# Patient Record
Sex: Female | Born: 1985 | Race: White | Hispanic: No | Marital: Single | Smoking: Never smoker
Health system: Southern US, Community
[De-identification: ages and names within clinical notes are randomized; demographics above are authoritative.]

## PROBLEM LIST (undated history)

## (undated) DIAGNOSIS — N920 Excessive and frequent menstruation with regular cycle: Secondary | ICD-10-CM

## (undated) DIAGNOSIS — R011 Cardiac murmur, unspecified: Secondary | ICD-10-CM

## (undated) DIAGNOSIS — D649 Anemia, unspecified: Secondary | ICD-10-CM

## (undated) DIAGNOSIS — IMO0001 Reserved for inherently not codable concepts without codable children: Secondary | ICD-10-CM

## (undated) HISTORY — PX: TONSILLECTOMY: SUR1361

## (undated) HISTORY — PX: WISDOM TOOTH EXTRACTION: SHX21

---

## 2007-10-19 ENCOUNTER — Ambulatory Visit: Payer: Self-pay

## 2008-11-07 ENCOUNTER — Ambulatory Visit: Payer: Self-pay | Admitting: General Practice

## 2009-03-22 ENCOUNTER — Ambulatory Visit: Payer: Self-pay | Admitting: General Practice

## 2010-04-17 ENCOUNTER — Emergency Department: Payer: Self-pay | Admitting: Emergency Medicine

## 2010-08-28 ENCOUNTER — Ambulatory Visit: Payer: Self-pay

## 2011-09-15 ENCOUNTER — Emergency Department (HOSPITAL_COMMUNITY): Payer: No Typology Code available for payment source

## 2011-09-15 ENCOUNTER — Encounter (HOSPITAL_COMMUNITY): Payer: Self-pay | Admitting: *Deleted

## 2011-09-15 ENCOUNTER — Emergency Department (HOSPITAL_COMMUNITY)
Admission: EM | Admit: 2011-09-15 | Discharge: 2011-09-15 | Disposition: A | Discharge status: Home / Self Care | Payer: No Typology Code available for payment source | Attending: Emergency Medicine | Admitting: Emergency Medicine

## 2011-09-15 DIAGNOSIS — S8000XA Contusion of unspecified knee, initial encounter: Secondary | ICD-10-CM | POA: Insufficient documentation

## 2011-09-15 DIAGNOSIS — S8001XA Contusion of right knee, initial encounter: Secondary | ICD-10-CM

## 2011-09-15 DIAGNOSIS — T148XXA Other injury of unspecified body region, initial encounter: Secondary | ICD-10-CM

## 2011-09-15 DIAGNOSIS — Y92410 Unspecified street and highway as the place of occurrence of the external cause: Secondary | ICD-10-CM | POA: Insufficient documentation

## 2011-09-15 MED ORDER — OXYCODONE-ACETAMINOPHEN 5-325 MG PO TABS
1.0000 | ORAL_TABLET | Freq: Once | ORAL | Status: AC
  Administered 2011-09-15: 1 via ORAL
  Filled 2011-09-15: qty 1

## 2011-09-15 MED ORDER — HYDROCODONE-ACETAMINOPHEN 5-325 MG PO TABS
1.0000 | ORAL_TABLET | Freq: Once | ORAL | Status: AC
  Administered 2011-09-15: 1 via ORAL
  Filled 2011-09-15: qty 1

## 2011-09-15 MED ORDER — IBUPROFEN 800 MG PO TABS: 800.0000 mg | ORAL_TABLET | Freq: Three times a day (TID) | ORAL | Status: AC
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
## 2011-09-15 NOTE — ED Provider Notes (Signed)
Medical screening examination/treatment/procedure(s) were performed by non-physician practitioner and as supervising physician I was immediately available for consultation/collaboration.   Nuh Lipton A Siennah Barrasso, MD 09/15/11 0642 

## 2011-10-14 ENCOUNTER — Ambulatory Visit: Payer: Self-pay | Admitting: General Practice

## 2011-10-16 ENCOUNTER — Encounter: Payer: Self-pay | Admitting: Physician Assistant

## 2011-11-01 ENCOUNTER — Encounter: Payer: Self-pay | Admitting: Physician Assistant

## 2012-06-12 ENCOUNTER — Emergency Department: Payer: Self-pay | Admitting: Emergency Medicine

## 2012-07-17 ENCOUNTER — Emergency Department: Payer: Self-pay | Admitting: Emergency Medicine

## 2013-07-18 ENCOUNTER — Ambulatory Visit: Payer: Self-pay | Admitting: Family Medicine

## 2013-07-18 DIAGNOSIS — Z0289 Encounter for other administrative examinations: Secondary | ICD-10-CM

## 2014-06-23 ENCOUNTER — Ambulatory Visit: Payer: Self-pay | Admitting: Family Medicine
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
## 2015-06-05 ENCOUNTER — Ambulatory Visit: Payer: Self-pay | Admitting: Physician Assistant

## 2015-06-05 ENCOUNTER — Encounter: Payer: Self-pay | Admitting: Physician Assistant

## 2015-06-05 VITALS — BP 120/80 | HR 72 | Temp 98.5°F

## 2015-06-05 DIAGNOSIS — J069 Acute upper respiratory infection, unspecified: Secondary | ICD-10-CM

## 2015-06-05 MED ORDER — HYDROCOD POLST-CPM POLST ER 10-8 MG/5ML PO SUER: 5.0000 mL | Freq: Two times a day (BID) | ORAL | Status: DC | PRN

## 2015-06-05 MED ORDER — METHYLPREDNISOLONE 4 MG PO TBPK: ORAL_TABLET | ORAL | Status: AC

## 2015-06-05 MED ORDER — AZITHROMYCIN 250 MG PO TABS: ORAL_TABLET | ORAL | Status: DC

## 2015-06-05 NOTE — Progress Notes (Signed)
S: C/o runny nose and congestion for 7 days, cough, sore throat, hoarse voice, no fever, chills, cp/sob, v/d; mucus is green and thick, cough is sporadic, c/o of facial and dental pain.   Using otc meds:   O: PE: vitals wnl, nad, perrl eomi, normocephalic, tms dull, nasal mucosa red and swollen, throat injected, neck supple no lymph, lungs c t a, cv rrr, neuro intact, cough is dry and hacking  A:  Acute sinusitis, uri   P: zpack, medrol dose pack, tussionex, drink fluids, continue regular meds , use otc meds of choice, return if not improving in 5 days, return earlier if worsening

## 2015-07-09 ENCOUNTER — Encounter: Payer: Self-pay | Admitting: Physician Assistant

## 2015-07-09 ENCOUNTER — Ambulatory Visit: Payer: Self-pay | Admitting: Physician Assistant

## 2015-07-09 VITALS — BP 119/79 | HR 79 | Temp 97.8°F

## 2015-07-09 DIAGNOSIS — J018 Other acute sinusitis: Secondary | ICD-10-CM

## 2015-07-09 MED ORDER — FLUCONAZOLE 150 MG PO TABS
ORAL_TABLET | ORAL | Status: DC
Start: 2015-07-09 — End: 2015-08-22

## 2015-07-09 MED ORDER — AMOXICILLIN 875 MG PO TABS: 875.0000 mg | ORAL_TABLET | Freq: Two times a day (BID) | ORAL | Status: DC

## 2015-07-09 NOTE — Progress Notes (Signed)
S: C/o runny nose and congestion for 5 days, no fever, chills, cp/sob, v/d; mucus is green and thick, cough is sporadic, c/o of facial and dental pain. Some sneezing and eyes watering, several coworkers have the same  Using otc meds:   O: PE: vitals nwl, nad,  perrl eomi, normocephalic, tms dull, nasal mucosa red and swollen, throat injected, neck supple no lymph, lungs c t a, cv rrr, neuro intact  A:  Acute sinusitis   P: amoxil 875 bid x 10d, diflucan; drink fluids, continue regular meds , use otc meds of choice, return if not improving in 5 days, return earlier if worsening

## 2015-08-22 ENCOUNTER — Ambulatory Visit: Payer: Self-pay | Admitting: Physician Assistant

## 2015-08-22 ENCOUNTER — Encounter: Payer: Self-pay | Admitting: Physician Assistant

## 2015-08-22 VITALS — BP 125/65 | HR 115 | Temp 98.4°F

## 2015-08-22 DIAGNOSIS — R509 Fever, unspecified: Secondary | ICD-10-CM

## 2015-08-22 LAB — POCT INFLUENZA A/B
Influenza A, POC: NEGATIVE
Influenza B, POC: NEGATIVE

## 2015-08-22 MED ORDER — METHYLPREDNISOLONE 4 MG PO TBPK: ORAL_TABLET | ORAL | Status: DC

## 2015-08-22 MED ORDER — HYDROCOD POLST-CPM POLST ER 10-8 MG/5ML PO SUER: 5.0000 mL | Freq: Two times a day (BID) | ORAL | Status: DC | PRN

## 2015-08-22 NOTE — Progress Notes (Signed)
S: C/o runny nose and congestion with dry cough for 3 days, + fever, chills, denies cp/sob, v/d;  Chest does feel a little tight, just weak and tired  Using otc meds: robitussin  O: PE: vitals wnl, nad, pt appears to not feel well;  perrl eomi, normocephalic, tms dull, nasal mucosa red and swollen, throat injected, neck supple no lymph, lungs c t a, cv rrr, neuro intact, flu swab neg  A:  Acute flu like illness   P: medrol dose pack for wheezing, tussionex 174ml nr for cough; drink fluids, continue regular meds , use otc meds of choice, return if not improving in 5 days, return earlier if worsening , if getting worse will call in omnicef on FRiday prior to the weekend

## 2015-08-24 ENCOUNTER — Ambulatory Visit: Payer: Self-pay | Admitting: Physician Assistant

## 2015-09-18 ENCOUNTER — Encounter: Payer: Self-pay | Admitting: Physician Assistant

## 2015-09-18 ENCOUNTER — Ambulatory Visit: Payer: Self-pay | Admitting: Physician Assistant

## 2015-09-18 VITALS — BP 110/60 | HR 85 | Temp 98.0°F

## 2015-09-18 DIAGNOSIS — N921 Excessive and frequent menstruation with irregular cycle: Secondary | ICD-10-CM

## 2015-09-18 DIAGNOSIS — R0602 Shortness of breath: Secondary | ICD-10-CM

## 2015-09-18 NOTE — Progress Notes (Signed)
S: c/o being a sob when running or going up stairs, heavy menstrual bleeding, has uterine fibroid, was seen by A. Copeland and given depo shot to reduce bleeding, has been having her period since Feb, recently started taking otc iron, has hx of heart murmur, hasn't been checked in several years  O: vitals wnl, nad, lungs c t a, cv rrr, murmur noted at aortic valve, ekg nsr  A: fatigue, sob, heart murmur, heavy vaginal bleeding  P: f/u with Dr Kenton Kingfisher for already scheduled appointment, cbc tsh drawn today, will refer to cardiology to assess heart murmur

## 2015-09-19 DIAGNOSIS — R011 Cardiac murmur, unspecified: Secondary | ICD-10-CM | POA: Insufficient documentation

## 2015-09-19 LAB — CBC WITH DIFFERENTIAL/PLATELET
BASOS: 0 %
Basophils Absolute: 0 10*3/uL (ref 0.0–0.2)
EOS (ABSOLUTE): 0 10*3/uL (ref 0.0–0.4)
Eos: 0 %
Hematocrit: 28.6 % — ABNORMAL LOW (ref 34.0–46.6)
Hemoglobin: 9.1 g/dL — ABNORMAL LOW (ref 11.1–15.9)
IMMATURE GRANS (ABS): 0 10*3/uL (ref 0.0–0.1)
Immature Granulocytes: 0 %
LYMPHS ABS: 1.4 10*3/uL (ref 0.7–3.1)
Lymphs: 27 %
MCH: 27.2 pg (ref 26.6–33.0)
MCHC: 31.8 g/dL (ref 31.5–35.7)
MCV: 86 fL (ref 79–97)
MONOS ABS: 0.6 10*3/uL (ref 0.1–0.9)
Monocytes: 13 %
NEUTROS ABS: 2.9 10*3/uL (ref 1.4–7.0)
Neutrophils: 60 %
PLATELETS: 410 10*3/uL — AB (ref 150–379)
RBC: 3.34 x10E6/uL — ABNORMAL LOW (ref 3.77–5.28)
RDW: 15.1 % (ref 12.3–15.4)
WBC: 5 10*3/uL (ref 3.4–10.8)

## 2015-09-19 LAB — THYROID PANEL WITH TSH
Free Thyroxine Index: 1.9 (ref 1.2–4.9)
T3 UPTAKE RATIO: 27 % (ref 24–39)
T4 TOTAL: 6.9 ug/dL (ref 4.5–12.0)
TSH: 0.639 u[IU]/mL (ref 0.450–4.500)

## 2015-10-31 ENCOUNTER — Encounter
Admission: RE | Admit: 2015-10-31 | Discharge: 2015-10-31 | Disposition: A | Discharge status: Home / Self Care | Payer: Managed Care, Other (non HMO) | Source: Ambulatory Visit | Attending: Obstetrics & Gynecology | Admitting: Obstetrics & Gynecology

## 2015-10-31 DIAGNOSIS — Z01812 Encounter for preprocedural laboratory examination: Secondary | ICD-10-CM | POA: Insufficient documentation

## 2015-10-31 HISTORY — DX: Reserved for inherently not codable concepts without codable children: IMO0001

## 2015-10-31 HISTORY — DX: Cardiac murmur, unspecified: R01.1

## 2015-10-31 HISTORY — DX: Excessive and frequent menstruation with regular cycle: N92.0

## 2015-10-31 LAB — CBC
HEMATOCRIT: 32.9 % — AB (ref 35.0–47.0)
Hemoglobin: 10.6 g/dL — ABNORMAL LOW (ref 12.0–16.0)
MCH: 25.4 pg — ABNORMAL LOW (ref 26.0–34.0)
MCHC: 32.3 g/dL (ref 32.0–36.0)
MCV: 78.5 fL — ABNORMAL LOW (ref 80.0–100.0)
PLATELETS: 294 10*3/uL (ref 150–440)
RBC: 4.19 MIL/uL (ref 3.80–5.20)
RDW: 17.2 % — AB (ref 11.5–14.5)
WBC: 3.4 10*3/uL — AB (ref 3.6–11.0)

## 2015-10-31 LAB — TYPE AND SCREEN
ABO/RH(D): O NEG
ANTIBODY SCREEN: NEGATIVE

## 2015-10-31 LAB — ABO/RH: ABO/RH(D): O NEG

## 2015-10-31 NOTE — Patient Instructions (Signed)
  Your procedure is scheduled on:11/08/15 Thurs Report to Same Day Surgery 2nd floor medical mall To find out your arrival time please call [phone] between [place] on Wed 11/07/15  Remember: Instructions that are not followed completely may result in serious medical risk, up to and including death, or upon the discretion of your surgeon and anesthesiologist your surgery may need to be rescheduled.    _x___ 1. Do not eat food or drink liquids after midnight. No gum chewing or hard candies.     __x__ 2. No Alcohol for 24 hours before or after surgery.   ____ 3. Bring all medications with you on the day of surgery if instructed.    __x__ 4. Notify your doctor if there is any change in your medical condition     (cold, fever, infections).     Do not wear jewelry, make-up, hairpins, clips or nail polish.  Do not wear lotions, powders, or perfumes. You may wear deodorant.  Do not shave 48 hours prior to surgery. Men may shave face and neck.  Do not bring valuables to the hospital.    [facility] is not responsible for any belongings or valuables.               Contacts, dentures or bridgework may not be worn into surgery.  Leave your suitcase in the car. After surgery it may be brought to your room.  For patients admitted to the hospital, discharge time is determined by your treatment team.   Patients discharged the day of surgery will not be allowed to drive home.    Please read over the following fact sheets that you were given:   [facility] Preparing for Surgery and or MRSA Information   _x___ Take these medicines the morning of surgery with A SIP OF WATER:    1. None  2.  3.  4.  5.  6.  ____ Fleet Enema (as directed)   _x___ Use CHG Soap or sage wipes as directed on instruction sheet   ____ Use inhalers on the day of surgery and bring to hospital day of surgery  ____ Stop metformin 2 days prior to surgery    ____ Take 1/2 of usual insulin dose the night before  surgery and none on the morning of           surgery.   ____ Stop aspirin or coumadin, or plavix  _x__ Stop Anti-inflammatories such as Advil, Aleve, Ibuprofen, Motrin, Naproxen,          Naprosyn, Goodies powders or aspirin products. Ok to take Tylenol.   ____ Stop supplements until after surgery.    ____ Bring C-Pap to the hospital.

## 2015-10-31 NOTE — Pre-Procedure Instructions (Signed)
[name], MD - 10/03/2015 9:30 AM EDT Formatting of this note may be different from the original. Established Patient Visit   Chief Complaint: Chief Complaint  Patient presents with  . Follow-up  ETT  Date of Service: 10/03/2015 Date of Birth: [birth year] PCP: Leona Carry TATE, MD  History of Present Illness: [name] is a 30 y.o.female patient who returns for evaluation of cardiac murmur and exertional dyspnea. The patient has had a history of a murmur since a young child out history of rheumatic fever per had a recent history of exertional dyspnea which is been attributed to the menstrual bleeding anemia. The patient reports that she has been doing much better, with resolution of menstrual bleeding. Underwent ETT on 09/27/2015, exercise 12 minutes on a Bruce protocol, without chest pain, shortness of breath or ECG changes. The patient has resumed her usual activity, and is exercising regularly, doing cardio on the elliptical for 2 miles without difficulty.  Past Medical and Surgical History  Past Medical History History reviewed. No pertinent past medical history.  Past Surgical History She has a past surgical history that includes tonsilectomy.   Medications and Allergies  Current Medications  Current Outpatient Prescriptions  Medication Sig Dispense Refill  . ferrous sulfate 325 (65 FE) MG tablet Take 325 mg by mouth daily with breakfast.  . medroxyPROGESTERone (DEPO-PROVERA) 150 mg/mL IM syringe INJECT 1 MILLILITER (150 MG) BY INTRAMUSCULAR ROUTE EVERY 3 MONTHS 0   No current facility-administered medications for this visit.   Allergies: Review of patient's allergies indicates no known allergies.  Social and Family History  Social History reports that she has never smoked. She does not have any smokeless tobacco history on file. She reports that she drinks alcohol. She reports that she does not use illicit drugs.  Family History Family History  Problem Relation Age of  Onset  . Coronary artery disease Father   Review of Systems   Review of Systems: The patient denies chest pain, shortness of breath, orthopnea, paroxysmal nocturnal dyspnea, pedal edema, palpitations, heart racing, presyncope, syncope. Review of 12 Systems is negative except as described above.  Physical Examination   Vitals: Visit Vitals  . BP (P) 102/60  . Pulse (P) 64  . Ht (P) 170.2 cm (5\' 7" )  . Wt (P) 69.6 kg (153 lb 6.4 oz)  . BMI (P) 24.03 kg/m2   Ht:(P) 170.2 cm (5\' 7" ) Wt:(P) 69.6 kg (153 lb 6.4 oz) [id number] surface area is 1.81 meters squared (pended). Body mass index is 24.03 kg/(m^2) (pended).  HEENT: Pupils equally reactive to light and accomodation  Neck: Supple without thyromegaly, carotid pulses 2+ Lungs: clear to auscultation bilaterally; no wheezes, rales, rhonchi Heart: Regular rate and rhythm. Grade 1/6 systolic murmur Abdomen: soft nontender, nondistended, with normal bowel sounds Extremities: no cyanosis, clubbing, or edema Peripheral Pulses: 2+ in all extremities, 2+ femoral pulses bilaterally  Assessment   30 y.o. female with  1. Cardiac murmur, unspecified  2. SOB (shortness of breath) on exertion   30 year old female with exertional dyspnea, likely due to anemia secondary to heavy menstrual bleeding which is resolved. Patient underwent ETT without chest pain, shortness of breath, or ECG changes. Mild systolic murmur likely not of clinical significance.  Plan   1. Continue current medications 2. Defer further cardiac diagnostics at this time 3. Return to clinic for follow-up as needed  No orders of the defined types were placed in this encounter.  Return if symptoms worsen or fail to improve.  ALEXANDER  PARASCHOS, MD

## 2015-11-08 ENCOUNTER — Ambulatory Visit: Payer: Managed Care, Other (non HMO) | Admitting: Anesthesiology

## 2015-11-08 ENCOUNTER — Ambulatory Visit
Admission: RE | Admit: 2015-11-08 | Discharge: 2015-11-08 | Disposition: A | Discharge status: Home / Self Care | Payer: Managed Care, Other (non HMO) | Source: Ambulatory Visit | Attending: Obstetrics & Gynecology | Admitting: Obstetrics & Gynecology

## 2015-11-08 ENCOUNTER — Encounter
Admission: RE | Disposition: A | Discharge status: Home / Self Care | Payer: Self-pay | Source: Ambulatory Visit | Attending: Obstetrics & Gynecology

## 2015-11-08 ENCOUNTER — Encounter: Payer: Self-pay | Admitting: Anesthesiology

## 2015-11-08 DIAGNOSIS — Z9889 Other specified postprocedural states: Secondary | ICD-10-CM | POA: Insufficient documentation

## 2015-11-08 DIAGNOSIS — N921 Excessive and frequent menstruation with irregular cycle: Secondary | ICD-10-CM | POA: Diagnosis not present

## 2015-11-08 DIAGNOSIS — Z8249 Family history of ischemic heart disease and other diseases of the circulatory system: Secondary | ICD-10-CM | POA: Insufficient documentation

## 2015-11-08 DIAGNOSIS — Z842 Family history of other diseases of the genitourinary system: Secondary | ICD-10-CM | POA: Insufficient documentation

## 2015-11-08 DIAGNOSIS — N946 Dysmenorrhea, unspecified: Secondary | ICD-10-CM | POA: Insufficient documentation

## 2015-11-08 DIAGNOSIS — N920 Excessive and frequent menstruation with regular cycle: Secondary | ICD-10-CM | POA: Diagnosis present

## 2015-11-08 DIAGNOSIS — Z793 Long term (current) use of hormonal contraceptives: Secondary | ICD-10-CM | POA: Diagnosis not present

## 2015-11-08 DIAGNOSIS — Z79899 Other long term (current) drug therapy: Secondary | ICD-10-CM | POA: Diagnosis not present

## 2015-11-08 DIAGNOSIS — Z8041 Family history of malignant neoplasm of ovary: Secondary | ICD-10-CM | POA: Insufficient documentation

## 2015-11-08 DIAGNOSIS — R5383 Other fatigue: Secondary | ICD-10-CM | POA: Diagnosis present

## 2015-11-08 DIAGNOSIS — F419 Anxiety disorder, unspecified: Secondary | ICD-10-CM | POA: Diagnosis not present

## 2015-11-08 DIAGNOSIS — D219 Benign neoplasm of connective and other soft tissue, unspecified: Secondary | ICD-10-CM | POA: Diagnosis present

## 2015-11-08 HISTORY — PX: LAPAROSCOPIC HYSTERECTOMY: SHX1926

## 2015-11-08 HISTORY — PX: CYSTOSCOPY: SHX5120

## 2015-11-08 LAB — POCT PREGNANCY, URINE: PREG TEST UR: NEGATIVE

## 2015-11-08 SURGERY — HYSTERECTOMY, TOTAL, LAPAROSCOPIC
Anesthesia: General

## 2015-11-08 MED ORDER — NEOSTIGMINE METHYLSULFATE 10 MG/10ML IV SOLN
INTRAVENOUS | Status: DC | PRN
  Administered 2015-11-08: 3 mg via INTRAVENOUS

## 2015-11-08 MED ORDER — OXYCODONE-ACETAMINOPHEN 5-325 MG PO TABS
ORAL_TABLET | ORAL | Status: AC
  Administered 2015-11-08: 1 via ORAL
  Filled 2015-11-08: qty 1

## 2015-11-08 MED ORDER — LACTATED RINGERS IV SOLN
INTRAVENOUS | Status: DC
  Administered 2015-11-08: 10:00:00 via INTRAVENOUS

## 2015-11-08 MED ORDER — ONDANSETRON HCL 4 MG/2ML IJ SOLN: 4.0000 mg | Freq: Once | INTRAMUSCULAR | Status: DC | PRN

## 2015-11-08 MED ORDER — OXYCODONE-ACETAMINOPHEN 5-325 MG PO TABS: 1.0000 | ORAL_TABLET | ORAL | Status: DC | PRN

## 2015-11-08 MED ORDER — BUPIVACAINE HCL (PF) 0.5 % IJ SOLN
INTRAMUSCULAR | Status: DC | PRN
  Administered 2015-11-08: 7 mL

## 2015-11-08 MED ORDER — LIDOCAINE HCL (CARDIAC) 20 MG/ML IV SOLN
INTRAVENOUS | Status: DC | PRN
  Administered 2015-11-08: 80 mg via INTRAVENOUS

## 2015-11-08 MED ORDER — FAMOTIDINE 20 MG PO TABS
ORAL_TABLET | ORAL | Status: AC
  Administered 2015-11-08: 20 mg via ORAL
  Filled 2015-11-08: qty 1

## 2015-11-08 MED ORDER — FENTANYL CITRATE (PF) 100 MCG/2ML IJ SOLN
INTRAMUSCULAR | Status: AC
  Filled 2015-11-08: qty 2

## 2015-11-08 MED ORDER — FENTANYL CITRATE (PF) 100 MCG/2ML IJ SOLN
25.0000 ug | INTRAMUSCULAR | Status: AC | PRN
  Administered 2015-11-08 (×6): 25 ug via INTRAVENOUS

## 2015-11-08 MED ORDER — FAMOTIDINE 20 MG PO TABS
20.0000 mg | ORAL_TABLET | Freq: Once | ORAL | Status: AC
  Administered 2015-11-08: 20 mg via ORAL

## 2015-11-08 MED ORDER — FENTANYL CITRATE (PF) 100 MCG/2ML IJ SOLN
INTRAMUSCULAR | Status: DC | PRN
  Administered 2015-11-08 (×4): 50 ug via INTRAVENOUS

## 2015-11-08 MED ORDER — DEXAMETHASONE SODIUM PHOSPHATE 10 MG/ML IJ SOLN
INTRAMUSCULAR | Status: DC | PRN
  Administered 2015-11-08: 4 mg via INTRAVENOUS

## 2015-11-08 MED ORDER — CEFOXITIN SODIUM-DEXTROSE 2-2.2 GM-% IV SOLR (PREMIX)
2.0000 g | Freq: Once | INTRAVENOUS | Status: AC
  Administered 2015-11-08: 2000 mg via INTRAVENOUS

## 2015-11-08 MED ORDER — PROPOFOL 10 MG/ML IV BOLUS
INTRAVENOUS | Status: DC | PRN
  Administered 2015-11-08: 50 mg via INTRAVENOUS
  Administered 2015-11-08: 150 mg via INTRAVENOUS

## 2015-11-08 MED ORDER — ONDANSETRON HCL 4 MG/2ML IJ SOLN
INTRAMUSCULAR | Status: DC | PRN
  Administered 2015-11-08: 4 mg via INTRAVENOUS

## 2015-11-08 MED ORDER — BUPIVACAINE HCL (PF) 0.5 % IJ SOLN
INTRAMUSCULAR | Status: AC
  Filled 2015-11-08: qty 30

## 2015-11-08 MED ORDER — OXYCODONE-ACETAMINOPHEN 5-325 MG PO TABS
1.0000 | ORAL_TABLET | Freq: Once | ORAL | Status: AC
  Administered 2015-11-08: 1 via ORAL

## 2015-11-08 MED ORDER — GLYCOPYRROLATE 0.2 MG/ML IJ SOLN
INTRAMUSCULAR | Status: DC | PRN
  Administered 2015-11-08: 0.4 mg via INTRAVENOUS

## 2015-11-08 MED ORDER — ROCURONIUM BROMIDE 100 MG/10ML IV SOLN
INTRAVENOUS | Status: DC | PRN
  Administered 2015-11-08: 30 mg via INTRAVENOUS
  Administered 2015-11-08: 5 mg via INTRAVENOUS
  Administered 2015-11-08: 10 mg via INTRAVENOUS
  Administered 2015-11-08: 5 mg via INTRAVENOUS
  Administered 2015-11-08: 10 mg via INTRAVENOUS

## 2015-11-08 MED ORDER — CEFOXITIN SODIUM-DEXTROSE 2-2.2 GM-% IV SOLR (PREMIX)
INTRAVENOUS | Status: AC
  Administered 2015-11-08: 2000 mg via INTRAVENOUS
  Filled 2015-11-08: qty 50

## 2015-11-08 MED ORDER — MIDAZOLAM HCL 2 MG/2ML IJ SOLN
INTRAMUSCULAR | Status: DC | PRN
  Administered 2015-11-08: 2 mg via INTRAVENOUS

## 2015-11-08 MED ORDER — KETOROLAC TROMETHAMINE 30 MG/ML IJ SOLN
INTRAMUSCULAR | Status: DC | PRN
  Administered 2015-11-08: 30 mg via INTRAVENOUS

## 2015-11-08 SURGICAL SUPPLY — 51 items
BAG URO DRAIN 2000ML W/SPOUT (MISCELLANEOUS) ×4 IMPLANT
BLADE SURG SZ11 CARB STEEL (BLADE) ×4 IMPLANT
CANISTER SUCT 1200ML W/VALVE (MISCELLANEOUS) ×4 IMPLANT
CATH FOLEY 2WAY  5CC 16FR (CATHETERS) ×2
CATH ROBINSON RED A/P 16FR (CATHETERS) ×4 IMPLANT
CATH URTH 16FR FL 2W BLN LF (CATHETERS) ×2 IMPLANT
CHLORAPREP W/TINT 26ML (MISCELLANEOUS) ×4 IMPLANT
DEFOGGER SCOPE WARMER CLEARIFY (MISCELLANEOUS) ×4 IMPLANT
DEVICE SUTURE ENDOST 10MM (ENDOMECHANICALS) ×4 IMPLANT
DRAPE CAMERA CLOSED 9X96 (DRAPES) ×4 IMPLANT
DRAPE UNDER BUTTOCK W/FLU (DRAPES) ×4 IMPLANT
DRSG TEGADERM 2-3/8X2-3/4 SM (GAUZE/BANDAGES/DRESSINGS) IMPLANT
ENDOSTITCH 0 SINGLE 48 (SUTURE) ×20 IMPLANT
GAUZE SPONGE NON-WVN 2X2 STRL (MISCELLANEOUS) IMPLANT
GLOVE BIO SURGEON STRL SZ8 (GLOVE) ×20 IMPLANT
GLOVE INDICATOR 8.0 STRL GRN (GLOVE) ×4 IMPLANT
GOWN STRL REUS W/ TWL LRG LVL3 (GOWN DISPOSABLE) ×2 IMPLANT
GOWN STRL REUS W/ TWL XL LVL3 (GOWN DISPOSABLE) ×4 IMPLANT
GOWN STRL REUS W/TWL LRG LVL3 (GOWN DISPOSABLE) ×2
GOWN STRL REUS W/TWL XL LVL3 (GOWN DISPOSABLE) ×4
IRRIGATION STRYKERFLOW (MISCELLANEOUS) ×2 IMPLANT
IRRIGATOR STRYKERFLOW (MISCELLANEOUS) ×4
IV LACTATED RINGERS 1000ML (IV SOLUTION) ×4 IMPLANT
KIT PINK PAD W/HEAD ARE REST (MISCELLANEOUS) ×4
KIT PINK PAD W/HEAD ARM REST (MISCELLANEOUS) ×2 IMPLANT
KIT RM TURNOVER CYSTO AR (KITS) ×4 IMPLANT
LABEL OR SOLS (LABEL) ×4 IMPLANT
LIQUID BAND (GAUZE/BANDAGES/DRESSINGS) ×4 IMPLANT
MANIPULATOR VCARE LG CRV RETR (MISCELLANEOUS) ×4 IMPLANT
MANIPULATOR VCARE SML CRV RETR (MISCELLANEOUS) IMPLANT
MANIPULATOR VCARE STD CRV RETR (MISCELLANEOUS) IMPLANT
NEEDLE VERESS 14GA 120MM (NEEDLE) ×4 IMPLANT
NS IRRIG 500ML POUR BTL (IV SOLUTION) ×4 IMPLANT
OCCLUDER COLPOPNEUMO (BALLOONS) ×4 IMPLANT
PACK GYN LAPAROSCOPIC (MISCELLANEOUS) ×4 IMPLANT
PAD OB MATERNITY 4.3X12.25 (PERSONAL CARE ITEMS) ×4 IMPLANT
PAD PREP 24X41 OB/GYN DISP (PERSONAL CARE ITEMS) ×4 IMPLANT
SCISSORS METZENBAUM CVD 33 (INSTRUMENTS) ×4 IMPLANT
SET CYSTO W/LG BORE CLAMP LF (SET/KITS/TRAYS/PACK) ×4 IMPLANT
SHEARS HARMONIC ACE PLUS 36CM (ENDOMECHANICALS) ×4 IMPLANT
SLEEVE ENDOPATH XCEL 5M (ENDOMECHANICALS) ×4 IMPLANT
SPONGE LAP 18X18 5 PK (GAUZE/BANDAGES/DRESSINGS) ×8 IMPLANT
SPONGE VERSALON 2X2 STRL (MISCELLANEOUS)
SPONGE XRAY 4X4 16PLY STRL (MISCELLANEOUS) ×4 IMPLANT
SURGILUBE 2OZ TUBE FLIPTOP (MISCELLANEOUS) ×4 IMPLANT
SUT VIC AB 2-0 UR6 27 (SUTURE) IMPLANT
SYR 50ML LL SCALE MARK (SYRINGE) ×4 IMPLANT
SYRINGE 10CC LL (SYRINGE) ×4 IMPLANT
TROCAR ENDO BLADELESS 11MM (ENDOMECHANICALS) ×4 IMPLANT
TROCAR XCEL NON-BLD 5MMX100MML (ENDOMECHANICALS) ×4 IMPLANT
TUBING INSUFFLATOR HEATED (MISCELLANEOUS) ×4 IMPLANT

## 2015-11-08 NOTE — Op Note (Signed)
Operative Note:  PRE-OP DIAGNOSIS: MENOMETRORRHAGIA,DYSMENORRHEA, FATIGUE   POST-OP DIAGNOSIS: MENOMETRORRHAGIA,DYSMENORRHEA, FATIGUE   PROCEDURE: Procedure(s): HYSTERECTOMY TOTAL LAPAROSCOPIC / BILATERAL SALPINGECTOMY CYSTOSCOPY  SURGEON: Barnett Applebaum, MD, FACOG  ASSISTANT: Dr Leonides Schanz   ANESTHESIA: General endotracheal anesthesia  ESTIMATED BLOOD LOSS: < 20 mL  SPECIMENS: Uterus, Tubes.  COMPLICATIONS: None  DISPOSITION: stable to PACU  FINDINGS: Intraabdominal adhesions were not noted. Normal ovaries, appendix, liver, GB.  PROCEDURE:  The patient was taken to the OR where anesthesia was administed. She was prepped and draped in the normal sterile fashion in the dorsal lithotomy position in the [place] stirrups. A time out was performed. A Graves speculum was inserted, the cervix was grasped with a single tooth tenaculum and the endometrial cavity was sounded. The cervix was progressively dilated to a size 18 [place] with [employer] dilators. A V-Care uterine manipulator was inserted in the usual fashion without incident. Gloves were changed and attention was turned to the abdomen.   An infraumbilical transverse 72mm skin incision was made with the scalpel after local anesthesia applied to the skin. A Veress-step needle was inserted in the usual fashion and confirmed using the hanging drop technique. A pneumoperitoneum was obtained by insufflation of CO2 (opening pressure of 13mmHg) to 13mmHg. A diagnostic laparoscopy was performed yielding the previously described findings. Attention was turned to the left lower quadrant where after visualization of the inferior epigastric vessels a 2mm skin incision was made with the scalpel. A 5 mm laparoscopic port was inserted. The same procedure was repeated in the right lower quadrant with a 8mm trocar. Attention was turned to the left aspect of the uterus, where after visualization of the ureter, the round ligament was coagulated and transected using the  12mm Harmonic Scapel. The anterior and posterior leafs of the broad ligament were dissected off as the anterior one was coagulated and transected in a caudal direction towards the cuff of the uterine manipulator. The vesicouterine reflection of the peritoneum was dissected with the harmonic scapel and the bladder flap was created bluntly. Attention was then turned to the left fallopian tube which was recognized by visualization of the fimbria. First the mesosalpinx and then the utero-ovarian ligament were coagulated and transected using the Harmonic Scapel. Attention was turned to the right aspect of the uterus where the same procedure was performed. The uterine vessels were sealed and transected bilaterally using first bipolar cautery and then the harmonic scapel. A 360 degree, circumferential colpotomy was done to completely amputate the uterus with cervix and tubes. Once the specimen was amputated it was delivered through the vagina.   The colpotomy was repaired in a simple interrupted ashion using a 0-Polysorb suture with an endo-stitch device.  Vaginal exam confirms complete closure.  The cavity was copiously irrigated. A survey of the pelvic cavity revealed adequate hemostasis and no injury to bowel, bladder, or ureter.  A diagnostic cystoscopy was performed using saline distension of bladder, with bilateral urine flow from each ureteral orifice.  No bladder lesions or injuries seen.   At this point the procedure was finalized. All the instruments were removed from the patient's body. Gas was expelled and patient is leveled.  Incisions are closed with skin adhesive.    Patient goes to recovery room in stable condition.  All sponge, instrument, and needle counts are correct x2.

## 2015-11-08 NOTE — Anesthesia Postprocedure Evaluation (Signed)
Anesthesia Post Note  Patient: Ann Vega  Procedure(s) Performed: Procedure(s) (LRB): HYSTERECTOMY TOTAL LAPAROSCOPIC / BILATERAL SALPINGECTOMY (Bilateral) CYSTOSCOPY (N/A)  Patient location during evaluation: PACU Anesthesia Type: General Level of consciousness: awake and alert Pain management: pain level controlled Vital Signs Assessment: post-procedure vital signs reviewed and stable Respiratory status: spontaneous breathing, nonlabored ventilation, respiratory function stable and patient connected to nasal cannula oxygen Cardiovascular status: blood pressure returned to baseline and stable Postop Assessment: no signs of nausea or vomiting Anesthetic complications: no    Last Vitals:  Filed Vitals:   11/08/15 1255 11/08/15 1306  BP:  118/58  Pulse: 71 60  Temp:  35.9 C  Resp: 14 16    Last Pain:  Filed Vitals:   11/08/15 1309  PainSc: [place]

## 2015-11-08 NOTE — Transfer of Care (Signed)
Immediate Anesthesia Transfer of Care Note  Patient: Ann Vega  Procedure(s) Performed: Procedure(s): HYSTERECTOMY TOTAL LAPAROSCOPIC / BILATERAL SALPINGECTOMY (Bilateral) CYSTOSCOPY (N/A)  Patient Location: PACU  Anesthesia Type:General  Level of Consciousness: awake, alert  and responds to stimulation  Airway & Oxygen Therapy: Patient Spontanous Breathing and Patient connected to face mask oxygen  Post-op Assessment: Report given to RN and Post -op Vital signs reviewed and stable  Post vital signs: Reviewed and stable  Last Vitals:  Filed Vitals:   11/08/15 0945 11/08/15 1201  BP: 117/77 116/63  Pulse: 68 72  Temp: 36.7 C 36.3 C  Resp: 16 8    Last Pain:  Filed Vitals:   11/08/15 1204  PainSc: 5          Complications: No apparent anesthesia complications

## 2015-11-08 NOTE — Discharge Instructions (Signed)
Total Laparoscopic Hysterectomy, Care After Refer to this sheet in the next few weeks. These instructions provide you with information on caring for yourself after your procedure. Your health care provider may also give you more specific instructions. Your treatment has been planned according to current medical practices, but problems sometimes occur. Call your health care provider if you have any problems or questions after your procedure. WHAT TO EXPECT AFTER THE PROCEDURE  Pain and bruising at the incision sites. You will be given pain medicine to control it.  Menopausal symptoms such as hot flashes, night sweats, and insomnia if your ovaries were removed.  Sore throat from the breathing tube that was inserted during surgery. HOME CARE INSTRUCTIONS  Only take over-the-counter or prescription medicines for pain, discomfort, or fever as directed by your health care provider.   Do not take aspirin. It can cause bleeding.   Do not drive when taking pain medicine.   Follow your health care provider's advice regarding diet, exercise, lifting, driving, and general activities.   Resume your usual diet as directed and allowed.   Get plenty of rest and sleep.   Do not douche, use tampons, or have sexual intercourse for at least 6 weeks, or until your health care provider gives you permission.   Change your bandages (dressings) as directed by your health care provider - - - REMOVE BY Saturday - - -.   Monitor your temperature and notify your health care provider of a fever.   Take showers instead of baths for 1 weeks.   Do not drink alcohol until your health care provider gives you permission (while on pain meds)   If you develop constipation, you may take a mild laxative with your health care provider's permission. Bran foods may help with constipation problems. Drinking enough fluids to keep your urine clear or pale yellow may help as well.   Try to have someone home with you  for 1-2 weeks to help around the house.   Keep all of your follow-up appointments as directed by your health care provider.  SEEK MEDICAL CARE IF:  You have swelling, redness, or increasing pain around your incision sites.   You have pus coming from your incision.   You notice a bad smell coming from your incision.   Your incision breaks open.   You feel dizzy or lightheaded.   You have pain or bleeding when you urinate.   You have persistent diarrhea.   You have persistent nausea and vomiting.   You have abnormal vaginal discharge.   You have a rash.   You have any type of abnormal reaction or develop an allergy to your medicine.   You have poor pain control with your prescribed medicine.  SEEK IMMEDIATE MEDICAL CARE IF:  You have chest pain or shortness of breath.  You have severe abdominal pain that is not relieved with pain medicine.  You have pain or swelling in your legs. MAKE SURE YOU:  Understand these instructions.  Will watch your condition.  Will get help right away if you are not doing well or get worse.     AMBULATORY SURGERY  DISCHARGE INSTRUCTIONS   1) The drugs that you were given will stay in your system until tomorrow so for the next 24 hours you should not:  A) Drive an automobile B) Make any legal decisions C) Drink any alcoholic beverage   2) You may resume regular meals tomorrow.  Today it is better to start with liquids  and gradually work up to solid foods.  You may eat anything you prefer, but it is better to start with liquids, then soup and crackers, and gradually work up to solid foods.   3) Please notify your doctor immediately if you have any unusual bleeding, trouble breathing, redness and pain at the surgery site, drainage, fever, or pain not relieved by medication.    4) Additional Instructions:        Please contact your physician with any problems or Same Day Surgery at [phone], Monday  through Friday 6 am to 4 pm, or [place] at [facility] number at [phone].

## 2015-11-08 NOTE — Anesthesia Preprocedure Evaluation (Addendum)
Anesthesia Evaluation  Patient identified by MRN, date of birth, ID band Patient awake    Reviewed: Allergy & Precautions, NPO status , Patient's Chart, lab work & pertinent test results, reviewed documented beta blocker date and time   Airway Mallampati: II  TM Distance: >3 FB     Dental  (+) Chipped   Pulmonary shortness of breath,           Cardiovascular      Neuro/Psych    GI/Hepatic   Endo/Other    Renal/GU      Musculoskeletal   Abdominal   Peds  Hematology   Anesthesia Other Findings Denies MVP. Anemic 10.6 Hb. U-preg neg.  Reproductive/Obstetrics                            Anesthesia Physical Anesthesia Plan  ASA: II  Anesthesia Plan: General   Post-op Pain Management:    Induction: Intravenous  Airway Management Planned: Oral ETT  Additional Equipment:   Intra-op Plan:   Post-operative Plan:   Informed Consent: I have reviewed the patients History and Physical, chart, labs and discussed the procedure including the risks, benefits and alternatives for the proposed anesthesia with the patient or authorized representative who has indicated his/her understanding and acceptance.     Plan Discussed with: CRNA  Anesthesia Plan Comments:         Anesthesia Quick Evaluation

## 2015-11-08 NOTE — H&P (Signed)
History and Physical Interval Note:  11/08/2015 10:08 AM  Ann Vega  has presented today for surgery, with the diagnosis of MENOMETRORRHAGIA,DYSMENORRHEA FATIGUE  The various methods of treatment have been discussed with the patient and family. After consideration of risks, benefits and other options for treatment, the patient has consented to  Procedure(s): HYSTERECTOMY TOTAL LAPAROSCOPIC / BILATERAL SALPINGECTOMY (Bilateral) CYSTOSCOPY (N/A) as a surgical intervention .  The patient's history has been reviewed, patient examined, no change in status, stable for surgery.  Pt has the following beta blocker history-  Not taking Beta Blocker.  I have reviewed the patient's chart and labs.  Questions were answered to the patient's satisfaction.    Hoyt Koch

## 2015-11-08 NOTE — Anesthesia Procedure Notes (Signed)
Procedure Name: Intubation Performed by: Lance Muss Pre-anesthesia Checklist: Patient identified, Emergency Drugs available, Suction available, Patient being monitored and Timeout performed Patient Re-evaluated:Patient Re-evaluated prior to inductionOxygen Delivery Method: Circle system utilized Preoxygenation: Pre-oxygenation with 100% oxygen Intubation Type: IV induction Ventilation: Mask ventilation without difficulty Laryngoscope Size: Mac and 3 Grade View: Grade I Tube type: Oral Tube size: 7.0 mm Number of attempts: 1 Airway Equipment and Method: Stylet Placement Confirmation: ETT inserted through vocal cords under direct vision,  positive ETCO2 and breath sounds checked- equal and bilateral Secured at: 22 cm Tube secured with: Tape Dental Injury: Teeth and Oropharynx as per pre-operative assessment

## 2015-11-09 LAB — SURGICAL PATHOLOGY

## 2015-12-26 ENCOUNTER — Encounter: Payer: Self-pay | Admitting: Physician Assistant

## 2015-12-26 ENCOUNTER — Ambulatory Visit: Payer: Self-pay | Admitting: Physician Assistant

## 2015-12-26 VITALS — BP 100/70 | HR 73 | Temp 98.8°F

## 2015-12-26 DIAGNOSIS — M84474S Pathological fracture, right foot, sequela: Secondary | ICD-10-CM
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
## 2016-02-05 ENCOUNTER — Encounter: Payer: Self-pay | Admitting: Physician Assistant

## 2016-02-05 ENCOUNTER — Ambulatory Visit: Payer: Self-pay | Admitting: Physician Assistant

## 2016-02-05 VITALS — BP 100/65 | HR 86 | Temp 98.6°F

## 2016-02-05 DIAGNOSIS — J029 Acute pharyngitis, unspecified: Secondary | ICD-10-CM

## 2016-02-05 LAB — POCT RAPID STREP A (OFFICE): Rapid Strep A Screen: NEGATIVE

## 2016-02-05 NOTE — Progress Notes (Signed)
S: C/o sore throat for 3 days, no fever, chills, cp/sob, v/d; was out of town this weekend and someone there was finishing a round of antibiotics for strep, states her tongue was coated with white Using otc meds:   O: PE: vitals wnl, nad, perrl eomi, normocephalic, tms dull, throat injected, neck supple no lymph, lungs c t a, cv rrr, neuro intact, q strep neg  A:  Acute pharyngitis   P: drink fluids, continue regular meds , use otc meds of choice, return if not improving in 5 days, return earlier if worsening

## 2016-07-07 ENCOUNTER — Ambulatory Visit: Payer: Self-pay | Admitting: Physician Assistant

## 2016-07-07 ENCOUNTER — Encounter: Payer: Self-pay | Admitting: Physician Assistant

## 2016-07-07 VITALS — BP 111/65 | HR 97 | Temp 98.6°F

## 2016-07-07 DIAGNOSIS — J209 Acute bronchitis, unspecified: Secondary | ICD-10-CM

## 2016-07-07 MED ORDER — HYDROCOD POLST-CPM POLST ER 10-8 MG/5ML PO SUER: 5.0000 mL | Freq: Two times a day (BID) | ORAL | 0 refills | Status: DC | PRN

## 2016-07-07 MED ORDER — METHYLPREDNISOLONE 4 MG PO TBPK: ORAL_TABLET | ORAL | 0 refills | Status: DC

## 2016-07-07 MED ORDER — ALBUTEROL SULFATE (2.5 MG/3ML) 0.083% IN NEBU: 2.5000 mg | INHALATION_SOLUTION | Freq: Four times a day (QID) | RESPIRATORY_TRACT | 1 refills | Status: DC | PRN

## 2016-07-07 NOTE — Progress Notes (Signed)
S: C/o cough and congestion with wheezing, chest is sore from coughing, denies fever, chills, or body aches;  cough is dry and hacking; keeping pt awake at night;  denies cardiac type chest pain or sob, v/d, abd pain, states the symptoms are worse when she is working out or when she is outside [place] neg,  O: vitals wnl, nad, tms clear, throat injected, neck supple no lymph, lungs c t a, cv rrr, neuro intact, + dry hacky cough  A:  Acute bronchitis   P:  rx medication: medrol dose pack, albuterol inhaler, tussionex 150 ml nr, use otc meds, tylenol or motrin as needed for fever/chills, return if not better in 3 -5 days, return earlier if worsening, if getting green mucus out the patient will call the office for antibiotic

## 2016-07-11 ENCOUNTER — Ambulatory Visit: Payer: Self-pay | Admitting: Physician Assistant

## 2016-07-11 ENCOUNTER — Encounter: Payer: Self-pay | Admitting: Physician Assistant

## 2016-07-11 VITALS — BP 121/69 | HR 78 | Temp 98.2°F | Ht 67.0 in | Wt 154.0 lb

## 2016-07-11 DIAGNOSIS — Z0000 Encounter for general adult medical examination without abnormal findings: Secondary | ICD-10-CM

## 2016-07-11 DIAGNOSIS — Z0189 Encounter for other specified special examinations: Secondary | ICD-10-CM

## 2016-07-11 DIAGNOSIS — Z008 Encounter for other general examination: Secondary | ICD-10-CM

## 2016-07-11 NOTE — Progress Notes (Signed)
S: here for biometrics and yearly physical, had partial hysterectomy this year, recent uri has cleared up, denies fever/chills/cp/sob/abd pain/bowel changes/or skin changes, remainder ros neg fam hx + high cholesterol, CAD, htn, alz.  O: vitals wnl, nad, ENT wnl ,neck supple no lymph, lungs c t a, cv rrr, abd soft nontender bs normal all 4 quads  A: well adult,   P: f/u prn, fasting labs exec panel , vit d

## 2016-07-12 LAB — CMP12+LP+TP+TSH+6AC+CBC/D/PLT
A/G RATIO: 1.9 (ref 1.2–2.2)
ALBUMIN: 4.7 g/dL (ref 3.5–5.5)
ALT: 29 IU/L (ref 0–32)
AST: 40 IU/L (ref 0–40)
Alkaline Phosphatase: 42 IU/L (ref 39–117)
BASOS ABS: 0 10*3/uL (ref 0.0–0.2)
BUN / CREAT RATIO: 15 (ref 9–23)
BUN: 13 mg/dL (ref 6–20)
Basos: 1 %
Bilirubin Total: 0.6 mg/dL (ref 0.0–1.2)
CHOLESTEROL TOTAL: 168 mg/dL (ref 100–199)
CREATININE: 0.85 mg/dL (ref 0.57–1.00)
Calcium: 9.7 mg/dL (ref 8.7–10.2)
Chloride: 102 mmol/L (ref 96–106)
Chol/HDL Ratio: 2.8 ratio units (ref 0.0–4.4)
EOS (ABSOLUTE): 0.1 10*3/uL (ref 0.0–0.4)
EOS: 2 %
Estimated CHD Risk: <0.5 times avg. (ref 0.0–1.0)
FREE THYROXINE INDEX: 1.5 (ref 1.2–4.9)
GFR calc Af Amer: 105 mL/min/{1.73_m2} (ref >59)
GFR, EST NON AFRICAN AMERICAN: 91 mL/min/{1.73_m2} (ref >59)
GGT: 11 IU/L (ref 0–60)
GLOBULIN, TOTAL: 2.5 g/dL (ref 1.5–4.5)
GLUCOSE: 82 mg/dL (ref 65–99)
HDL: 60 mg/dL (ref >39)
Hematocrit: 43.8 % (ref 34.0–46.6)
Hemoglobin: 14.6 g/dL (ref 11.1–15.9)
IMMATURE GRANULOCYTES: 0 %
IRON: 140 ug/dL (ref 27–159)
Immature Grans (Abs): 0 10*3/uL (ref 0.0–0.1)
LDH: 237 IU/L — ABNORMAL HIGH (ref 119–226)
LDL Calculated: 98 mg/dL (ref 0–99)
Lymphocytes Absolute: 1.1 10*3/uL (ref 0.7–3.1)
Lymphs: 29 %
MCH: 30.1 pg (ref 26.6–33.0)
MCHC: 33.3 g/dL (ref 31.5–35.7)
MCV: 90 fL (ref 79–97)
MONOCYTES: 15 %
Monocytes Absolute: 0.6 10*3/uL (ref 0.1–0.9)
NEUTROS PCT: 53 %
Neutrophils Absolute: 2.1 10*3/uL (ref 1.4–7.0)
PHOSPHORUS: 3.5 mg/dL (ref 2.5–4.5)
PLATELETS: 290 10*3/uL (ref 150–379)
Potassium: 4.5 mmol/L (ref 3.5–5.2)
RBC: 4.85 x10E6/uL (ref 3.77–5.28)
RDW: 13.1 % (ref 12.3–15.4)
Sodium: 140 mmol/L (ref 134–144)
T3 UPTAKE RATIO: 27 % (ref 24–39)
T4 TOTAL: 5.7 ug/dL (ref 4.5–12.0)
TOTAL PROTEIN: 7.2 g/dL (ref 6.0–8.5)
TRIGLYCERIDES: 48 mg/dL (ref 0–149)
TSH: 0.774 u[IU]/mL (ref 0.450–4.500)
Uric Acid: 4.5 mg/dL (ref 2.5–7.1)
VLDL Cholesterol Cal: 10 mg/dL (ref 5–40)
WBC: 3.8 10*3/uL (ref 3.4–10.8)

## 2016-07-12 LAB — VITAMIN D 25 HYDROXY (VIT D DEFICIENCY, FRACTURES): VIT D 25 HYDROXY: 33.6 ng/mL (ref 30.0–100.0)

## 2016-12-31 ENCOUNTER — Ambulatory Visit: Payer: Self-pay | Admitting: Physician Assistant

## 2016-12-31 DIAGNOSIS — M7702 Medial epicondylitis, left elbow: Secondary | ICD-10-CM | POA: Insufficient documentation

## 2016-12-31 DIAGNOSIS — M67912 Unspecified disorder of synovium and tendon, left shoulder: Secondary | ICD-10-CM

## 2016-12-31 MED ORDER — PREDNISONE 10 MG PO TABS: ORAL_TABLET | ORAL | 0 refills | Status: DC

## 2016-12-31 NOTE — Progress Notes (Signed)
S:  Here with left shoulder and elbow pain.  No known injury but has a hx of rotator cuff problems in the past.  Now pain in upper left shoulder especially while sleeping and with certain movements.  Lifting and gripping increases pain at elbow.  Patient works out "a lot".   O:  Left shoulder without deformity.  No crepitus with ROM.  Pain with exertion on abduction.  Exam of left elbow is without edema.  Moderate tenderness medial epicondyle.  Pain increased with making a fist.  No restriction of ROM. Skin without discoloration. A:  Rotator cuff strain/ ? Tear      Medial epicondylitis left elbow P:  Prednisone 60mg  taper       Norco 1 @ hs # 5  Hand written RX

## 2017-03-30 ENCOUNTER — Ambulatory Visit: Payer: Self-pay | Admitting: Physician Assistant

## 2017-03-31 ENCOUNTER — Encounter: Payer: Self-pay | Admitting: Physician Assistant

## 2017-03-31 ENCOUNTER — Ambulatory Visit: Payer: Self-pay | Admitting: Physician Assistant

## 2017-03-31 VITALS — BP 130/80 | HR 67 | Temp 98.5°F | Resp 16

## 2017-03-31 DIAGNOSIS — S39012A Strain of muscle, fascia and tendon of lower back, initial encounter: Secondary | ICD-10-CM

## 2017-03-31 DIAGNOSIS — T148XXA Other injury of unspecified body region, initial encounter: Secondary | ICD-10-CM

## 2017-03-31 MED ORDER — TETANUS-DIPHTH-ACELL PERTUSSIS 5-2.5-18.5 LF-MCG/0.5 IM SUSP
0.5000 mL | Freq: Once | INTRAMUSCULAR | Status: AC
  Administered 2017-03-31: 0.5 mL via INTRAMUSCULAR

## 2017-03-31 MED ORDER — CYCLOBENZAPRINE HCL 10 MG PO TABS: 10.0000 mg | ORAL_TABLET | Freq: Three times a day (TID) | ORAL | 0 refills | Status: DC | PRN
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
## 2020-05-09 ENCOUNTER — Other Ambulatory Visit: Payer: Self-pay | Admitting: Orthopedic Surgery

## 2020-05-09 ENCOUNTER — Encounter
Admission: RE | Admit: 2020-05-09 | Discharge: 2020-05-09 | Disposition: A | Discharge status: Home / Self Care | Payer: Managed Care, Other (non HMO) | Source: Ambulatory Visit | Attending: Orthopedic Surgery | Admitting: Orthopedic Surgery

## 2020-05-09 ENCOUNTER — Other Ambulatory Visit: Payer: Self-pay

## 2020-05-09 ENCOUNTER — Encounter: Payer: Self-pay | Admitting: Orthopedic Surgery

## 2020-05-09 HISTORY — DX: Anemia, unspecified: D64.9

## 2020-05-09 NOTE — Patient Instructions (Signed)
Your procedure is scheduled on: May 16, 2020 Wednesday  Report to the Registration Desk on the 1st floor of the [facility]. To find out your arrival time, please call [phone] between 1PM - 3PM on: May 15, 2020  REMEMBER: Instructions that are not followed completely may result in serious medical risk, up to and including death; or upon the discretion of your surgeon and anesthesiologist your surgery may need to be rescheduled.  Do not eat food after midnight the night before surgery.  No gum chewing, lozengers or hard candies.  You may however, drink CLEAR liquids up to 2 hours before you are scheduled to arrive for your surgery. Do not drink anything within 2 hours of your scheduled arrival time.  Clear liquids include: - water  - apple juice without pulp - gatorade (not RED, PURPLE, OR BLUE) - black coffee or tea (Do NOT add milk or creamers to the coffee or tea) Do NOT drink anything that is not on this list.  Type 1 and Type 2 diabetics should only drink water.  TAKE THESE MEDICATIONS THE MORNING OF SURGERY WITH A SIP OF WATER: NONE  One week prior to surgery: Stop Anti-inflammatories (NSAIDS) such as Advil, Aleve, Ibuprofen, Motrin, Naproxen, Naprosyn and ASPIRIN OR Aspirin based products such as Excedrin, Goodys Powder, BC Powder. Stop ANY OVER THE COUNTER supplements until after surgery. (However, you may continue taking Vitamin D, Vitamin B, and multivitamin up until the day before surgery.)  No Alcohol for 24 hours before or after surgery.  No Smoking including e-cigarettes for 24 hours prior to surgery.  No chewable tobacco products for at least 6 hours prior to surgery.  No nicotine patches on the day of surgery.  Do not use any "recreational" drugs for at least a week prior to your surgery.  Please be advised that the combination of cocaine and anesthesia may have negative outcomes, up to and including death. If you test positive for cocaine, your  surgery will be cancelled.  On the morning of surgery brush your teeth with toothpaste and water, you may rinse your mouth with mouthwash if you wish. Do not swallow any toothpaste or mouthwash.  Do not wear jewelry, make-up, hairpins, clips or nail polish.  Do not wear lotions, powders, or perfumes.   Do not shave body from the neck down 48 hours prior to surgery just in case you cut yourself which could leave a site for infection.  Also, freshly shaved skin may become irritated if using the CHG soap.  Contact lenses, hearing aids and dentures may not be worn into surgery.  Do not bring valuables to the hospital. [facility] is not responsible for any missing/lost belongings or valuables.   Use CHG Soap as directed on instruction sheet.  Notify your doctor if there is any change in your medical condition (cold, fever, infection).  Wear comfortable clothing (specific to your surgery type) to the hospital.  Plan for stool softeners for home use; pain medications have a tendency to cause constipation. You can also help prevent constipation by eating foods high in fiber such as fruits and vegetables and drinking plenty of fluids as your diet allows.  After surgery, you can help prevent lung complications by doing breathing exercises.  Take deep breaths and cough every 1-2 hours. Your doctor may order a device called an Incentive Spirometer to help you take deep breaths. When coughing or sneezing, hold a pillow firmly against your incision with both hands. This is  called "splinting." Doing this helps protect your incision. It also decreases belly discomfort.   If you are being discharged the day of surgery, you will not be allowed to drive home. You will need a responsible adult (18 years or older) to drive you home and stay with you that night.   Please call the [place] Dept. at [phone] if you have any questions about these instructions.  Visitation  Policy:  Patients undergoing a surgery or procedure may have one family member or support person with them as long as that person is not COVID-19 positive or experiencing its symptoms.  That person may remain in the waiting area during the procedure.  Inpatient Visitation Update:   In an effort to ensure the safety of our team members and our patients, we are implementing a change to our visitation policy:  Effective Monday, Aug. 9, at 7 a.m., inpatients will be allowed one support person.  o The support person may change daily.  o The support person must pass our screening, gel in and out, and wear a mask at all times, including in the patient's room.  o Patients must also wear a mask when staff or their support person are in the room.  o Masking is required regardless of vaccination status.  Systemwide, no visitors 17 or younger.

## 2020-05-09 NOTE — H&P (Signed)
Ann Vega MRN:  [id number] DOB/SEX:  09-16-1985/female  CHIEF COMPLAINT:  Painful right Knee  HISTORY: Patient is a 34 y.o. female presented with a history of pain in the right knee. Onset of symptoms was abrupt starting several months ago with gradually worsening course since that time. Prior procedures on the knee include none. Patient has been treated conservatively with over-the-counter NSAIDs and activity modification. Patient currently rates pain in the knee at 10 out of 10 with activity. There is pain at night.  PAST MEDICAL HISTORY: Patient Active Problem List   Diagnosis Date Noted  . Medial epicondylitis of elbow, left 12/31/2016  . Disorder of rotator cuff, left 12/31/2016  . Fibroid 11/08/2015  . Menorrhagia 11/08/2015  . Fatigue 11/08/2015  . Cardiac murmur 09/19/2015   Past Medical History:  Diagnosis Date  . Heavy periods   . Murmur, cardiac   . Shortness of breath dyspnea    Past Surgical History:  Procedure Laterality Date  . CYSTOSCOPY N/A 11/08/2015   Procedure: CYSTOSCOPY;  Surgeon: Gae Dry, MD;  Location: [facility] ORS;  Service: Gynecology;  Laterality: N/A;  . LAPAROSCOPIC HYSTERECTOMY Bilateral 11/08/2015   Procedure: HYSTERECTOMY TOTAL LAPAROSCOPIC / BILATERAL SALPINGECTOMY;  Surgeon: Gae Dry, MD;  Location: [facility] ORS;  Service: Gynecology;  Laterality: Bilateral;  . TONSILLECTOMY    . WISDOM TOOTH EXTRACTION       MEDICATIONS:  (Not in a hospital admission)   ALLERGIES:  No Known Allergies  REVIEW OF SYSTEMS:  Pertinent items are noted in HPI.   FAMILY HISTORY:  No family history on file.  SOCIAL HISTORY:   Social History   Tobacco Use  . Smoking status: Never Smoker  . Smokeless tobacco: Never Used  Substance Use Topics  . Alcohol use: Yes    Comment: occ     EXAMINATION:  Vital signs in last 24 hours: @VSRANGES @  General appearance: alert, cooperative and no distress Neck: no JVD and supple, symmetrical, trachea  midline Lungs: clear to auscultation bilaterally Heart: regular rate and rhythm, S1, S2 normal, no murmur, click, rub or gallop Abdomen: soft, non-tender; bowel sounds normal; no masses,  no organomegaly Extremities: extremities normal, atraumatic, no cyanosis or edema and Homans sign is negative, no sign of DVT Pulses: 2+ and symmetric Neurologic: Alert and oriented X 3, normal strength and tone. Normal symmetric reflexes. 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 The overall alignment is neutral. The bone quality appears to be good for age and reported activity level.  Assessment/Plan:  right knee meniscal tear  The patient history, physical examination and imaging studies are consistent with right knee meniscal tear of the right knee. The patient has failed conservative treatment.  The clearance notes were reviewed.  After discussion with the patient it was felt that right knee arthroscopy  was indicated. The procedure,  risks, and benefits of total knee arthroplasty were presented and reviewed. The risks including but not limited to aseptic loosening, infection, blood clots, vascular injury, stiffness, patella tracking problems complications among others were discussed. The patient acknowledged the explanation, agreed to proceed with the plan.  Carlynn Spry 05/09/2020, 9:37 AM

## 2020-05-14 ENCOUNTER — Other Ambulatory Visit: Payer: Self-pay

## 2020-05-14 ENCOUNTER — Other Ambulatory Visit
Admission: RE | Admit: 2020-05-14 | Discharge: 2020-05-14 | Disposition: A | Discharge status: Home / Self Care | Payer: BC Managed Care – PPO | Source: Ambulatory Visit | Attending: Orthopedic Surgery | Admitting: Orthopedic Surgery

## 2020-05-14 DIAGNOSIS — Z01812 Encounter for preprocedural laboratory examination: Secondary | ICD-10-CM | POA: Insufficient documentation

## 2020-05-14 DIAGNOSIS — Z20822 Contact with and (suspected) exposure to covid-19: Secondary | ICD-10-CM | POA: Insufficient documentation

## 2020-05-14 LAB — SARS CORONAVIRUS 2 (TAT 6-24 HRS): SARS Coronavirus 2: NEGATIVE

## 2020-05-16 ENCOUNTER — Encounter: Payer: Self-pay | Admitting: Orthopedic Surgery

## 2020-05-16 ENCOUNTER — Other Ambulatory Visit: Payer: Self-pay

## 2020-05-16 ENCOUNTER — Ambulatory Visit: Payer: BC Managed Care – PPO | Admitting: Anesthesiology

## 2020-05-16 ENCOUNTER — Ambulatory Visit
Admission: RE | Admit: 2020-05-16 | Discharge: 2020-05-16 | Disposition: A | Discharge status: Home / Self Care | Payer: BC Managed Care – PPO | Attending: Orthopedic Surgery | Admitting: Orthopedic Surgery

## 2020-05-16 ENCOUNTER — Encounter
Admission: RE | Disposition: A | Discharge status: Home / Self Care | Payer: Self-pay | Source: Home / Self Care | Attending: Orthopedic Surgery

## 2020-05-16 DIAGNOSIS — Z791 Long term (current) use of non-steroidal anti-inflammatories (NSAID): Secondary | ICD-10-CM | POA: Diagnosis not present

## 2020-05-16 DIAGNOSIS — S83241A Other tear of medial meniscus, current injury, right knee, initial encounter: Secondary | ICD-10-CM | POA: Diagnosis not present

## 2020-05-16 DIAGNOSIS — X58XXXA Exposure to other specified factors, initial encounter: Secondary | ICD-10-CM | POA: Diagnosis not present

## 2020-05-16 DIAGNOSIS — M94261 Chondromalacia, right knee: Secondary | ICD-10-CM | POA: Insufficient documentation

## 2020-05-16 HISTORY — PX: KNEE ARTHROSCOPY WITH MEDIAL MENISECTOMY: SHX5651

## 2020-05-16 SURGERY — ARTHROSCOPY, KNEE, WITH MEDIAL MENISCECTOMY
Anesthesia: General | Site: Knee | Laterality: Right

## 2020-05-16 MED ORDER — ONDANSETRON HCL 4 MG/2ML IJ SOLN
INTRAMUSCULAR | Status: DC | PRN
  Administered 2020-05-16: 4 mg via INTRAVENOUS

## 2020-05-16 MED ORDER — DEXAMETHASONE SODIUM PHOSPHATE 10 MG/ML IJ SOLN
INTRAMUSCULAR | Status: AC
  Filled 2020-05-16: qty 1

## 2020-05-16 MED ORDER — CEFAZOLIN SODIUM-DEXTROSE 2-4 GM/100ML-% IV SOLN
INTRAVENOUS | Status: AC
  Filled 2020-05-16: qty 100

## 2020-05-16 MED ORDER — BUPIVACAINE-EPINEPHRINE (PF) 0.25% -1:200000 IJ SOLN
INTRAMUSCULAR | Status: DC | PRN
  Administered 2020-05-16: 10 mL

## 2020-05-16 MED ORDER — METHYLPREDNISOLONE ACETATE 40 MG/ML IJ SUSP
INTRAMUSCULAR | Status: AC
  Filled 2020-05-16: qty 1

## 2020-05-16 MED ORDER — CHLORHEXIDINE GLUCONATE 0.12 % MT SOLN
OROMUCOSAL | Status: AC
  Filled 2020-05-16: qty 15

## 2020-05-16 MED ORDER — LACTATED RINGERS IV SOLN
INTRAVENOUS | Status: DC | PRN
  Administered 2020-05-16: 09:00:00 4 mL

## 2020-05-16 MED ORDER — CEFAZOLIN SODIUM-DEXTROSE 2-4 GM/100ML-% IV SOLN
2.0000 g | INTRAVENOUS | Status: AC
  Administered 2020-05-16: 09:00:00 2 g via INTRAVENOUS

## 2020-05-16 MED ORDER — ACETAMINOPHEN 10 MG/ML IV SOLN
INTRAVENOUS | Status: AC
  Filled 2020-05-16: qty 100

## 2020-05-16 MED ORDER — CHLORHEXIDINE GLUCONATE 0.12 % MT SOLN: 15.0000 mL | Freq: Once | OROMUCOSAL | Status: AC

## 2020-05-16 MED ORDER — OXYCODONE HCL 5 MG PO TABS
5.0000 mg | ORAL_TABLET | Freq: Once | ORAL | Status: AC
Start: 2020-05-16 — End: 2020-05-16

## 2020-05-16 MED ORDER — ORAL CARE MOUTH RINSE
15.0000 mL | Freq: Once | OROMUCOSAL | Status: AC
  Administered 2020-05-16: 07:00:00 15 mL via OROMUCOSAL

## 2020-05-16 MED ORDER — LIDOCAINE HCL (CARDIAC) PF 100 MG/5ML IV SOSY
PREFILLED_SYRINGE | INTRAVENOUS | Status: DC | PRN
  Administered 2020-05-16: 100 mg via INTRAVENOUS

## 2020-05-16 MED ORDER — OXYCODONE HCL 5 MG PO TABS
ORAL_TABLET | ORAL | Status: AC
  Filled 2020-05-16: qty 1

## 2020-05-16 MED ORDER — DEXMEDETOMIDINE (PRECEDEX) IN NS 20 MCG/5ML (4 MCG/ML) IV SYRINGE
PREFILLED_SYRINGE | INTRAVENOUS | Status: DC | PRN
  Administered 2020-05-16 (×2): 8 ug via INTRAVENOUS

## 2020-05-16 MED ORDER — ROPIVACAINE HCL 5 MG/ML IJ SOLN
INTRAMUSCULAR | Status: AC
  Filled 2020-05-16: qty 20

## 2020-05-16 MED ORDER — FAMOTIDINE 20 MG PO TABS: 20.0000 mg | ORAL_TABLET | Freq: Once | ORAL | Status: AC

## 2020-05-16 MED ORDER — FENTANYL CITRATE (PF) 100 MCG/2ML IJ SOLN
INTRAMUSCULAR | Status: AC
  Filled 2020-05-16: qty 2

## 2020-05-16 MED ORDER — PROPOFOL 10 MG/ML IV BOLUS
INTRAVENOUS | Status: DC | PRN
  Administered 2020-05-16: 150 mg via INTRAVENOUS
  Administered 2020-05-16: 50 mg via INTRAVENOUS

## 2020-05-16 MED ORDER — OXYCODONE HCL 5 MG PO TABS
ORAL_TABLET | ORAL | Status: AC
  Administered 2020-05-16: 12:00:00 5 mg via ORAL
  Filled 2020-05-16: qty 1

## 2020-05-16 MED ORDER — OXYCODONE HCL 5 MG PO TABS
5.0000 mg | ORAL_TABLET | Freq: Once | ORAL | Status: AC
  Administered 2020-05-16: 5 mg via ORAL

## 2020-05-16 MED ORDER — FENTANYL CITRATE (PF) 100 MCG/2ML IJ SOLN
25.0000 ug | INTRAMUSCULAR | Status: DC | PRN
  Administered 2020-05-16 (×3): 25 ug via INTRAVENOUS

## 2020-05-16 MED ORDER — BUPIVACAINE-EPINEPHRINE (PF) 0.25% -1:200000 IJ SOLN
INTRAMUSCULAR | Status: AC
  Filled 2020-05-16: qty 30

## 2020-05-16 MED ORDER — EPINEPHRINE PF 1 MG/ML IJ SOLN
INTRAMUSCULAR | Status: AC
  Filled 2020-05-16: qty 4

## 2020-05-16 MED ORDER — KETOROLAC TROMETHAMINE 30 MG/ML IJ SOLN
INTRAMUSCULAR | Status: AC
  Administered 2020-05-16: 12:00:00 30 mg via INTRAVENOUS
  Filled 2020-05-16: qty 1

## 2020-05-16 MED ORDER — LIDOCAINE HCL (PF) 2 % IJ SOLN
INTRAMUSCULAR | Status: AC
  Filled 2020-05-16: qty 5

## 2020-05-16 MED ORDER — FENTANYL CITRATE (PF) 100 MCG/2ML IJ SOLN
INTRAMUSCULAR | Status: AC
  Administered 2020-05-16: 10:00:00 25 ug via INTRAVENOUS
  Filled 2020-05-16: qty 2

## 2020-05-16 MED ORDER — LACTATED RINGERS IV SOLN: INTRAVENOUS | Status: DC

## 2020-05-16 MED ORDER — ROPIVACAINE HCL 5 MG/ML IJ SOLN
INTRAMUSCULAR | Status: DC | PRN
  Administered 2020-05-16: 15 mL

## 2020-05-16 MED ORDER — HYDROCODONE-ACETAMINOPHEN 5-325 MG PO TABS: 1.0000 | ORAL_TABLET | ORAL | 0 refills | Status: AC | PRN

## 2020-05-16 MED ORDER — METHYLPREDNISOLONE ACETATE 40 MG/ML IJ SUSP
INTRAMUSCULAR | Status: DC | PRN
  Administered 2020-05-16: 40 mg

## 2020-05-16 MED ORDER — KETOROLAC TROMETHAMINE 30 MG/ML IJ SOLN: 30.0000 mg | Freq: Once | INTRAMUSCULAR | Status: AC

## 2020-05-16 MED ORDER — MIDAZOLAM HCL 2 MG/2ML IJ SOLN
INTRAMUSCULAR | Status: DC | PRN
  Administered 2020-05-16: 2 mg via INTRAVENOUS

## 2020-05-16 MED ORDER — FENTANYL CITRATE (PF) 100 MCG/2ML IJ SOLN
INTRAMUSCULAR | Status: DC | PRN
  Administered 2020-05-16 (×2): 50 ug via INTRAVENOUS

## 2020-05-16 MED ORDER — ONDANSETRON HCL 4 MG/2ML IJ SOLN
INTRAMUSCULAR | Status: AC
  Filled 2020-05-16: qty 2

## 2020-05-16 MED ORDER — PROPOFOL 500 MG/50ML IV EMUL
INTRAVENOUS | Status: AC
  Filled 2020-05-16: qty 50

## 2020-05-16 MED ORDER — ONDANSETRON HCL 4 MG/2ML IJ SOLN: 4.0000 mg | Freq: Once | INTRAMUSCULAR | Status: DC | PRN

## 2020-05-16 MED ORDER — DEXAMETHASONE SODIUM PHOSPHATE 10 MG/ML IJ SOLN
INTRAMUSCULAR | Status: DC | PRN
  Administered 2020-05-16: 10 mg via INTRAVENOUS

## 2020-05-16 MED ORDER — FAMOTIDINE 20 MG PO TABS
ORAL_TABLET | ORAL | Status: AC
  Administered 2020-05-16: 07:00:00 20 mg via ORAL
  Filled 2020-05-16: qty 1

## 2020-05-16 MED ORDER — ACETAMINOPHEN 10 MG/ML IV SOLN
INTRAVENOUS | Status: DC | PRN
  Administered 2020-05-16: 1000 mg via INTRAVENOUS

## 2020-05-16 MED ORDER — MIDAZOLAM HCL 2 MG/2ML IJ SOLN
INTRAMUSCULAR | Status: AC
  Filled 2020-05-16: qty 2

## 2020-05-16 SURGICAL SUPPLY — 37 items
ADAPTER IRRIG TUBE 2 SPIKE SOL (ADAPTER) ×6 IMPLANT
ADPR TBG 2 SPK PMP STRL ASCP (ADAPTER) ×2
APL PRP STRL LF DISP 70% ISPRP (MISCELLANEOUS) ×1
BLADE FULL RADIUS 3.5 (BLADE) IMPLANT
BLADE INCISOR PLUS 4.5 (BLADE) ×3 IMPLANT
BLADE SHAVER 4.5 DBL SERAT CV (CUTTER) IMPLANT
BLADE SURG SZ11 CARB STEEL (BLADE) ×3 IMPLANT
BRUSH SCRUB EZ  4% CHG (MISCELLANEOUS) ×4
BRUSH SCRUB EZ 4% CHG (MISCELLANEOUS) ×2 IMPLANT
CHLORAPREP W/TINT 26 (MISCELLANEOUS) ×3 IMPLANT
COOLER POLAR GLACIER W/PUMP (MISCELLANEOUS) ×3 IMPLANT
COVER WAND RF STERILE (DRAPES) ×3 IMPLANT
DRAPE SPLIT 6X30 W/TAPE (DRAPES) ×3 IMPLANT
GAUZE SPONGE 4X4 12PLY STRL (GAUZE/BANDAGES/DRESSINGS) ×3 IMPLANT
GAUZE XEROFORM 1X8 LF (GAUZE/BANDAGES/DRESSINGS) ×3 IMPLANT
GLOVE INDICATOR 8.0 STRL GRN (GLOVE) ×3 IMPLANT
GLOVE SURG ORTHO 8.0 STRL STRW (GLOVE) ×3 IMPLANT
GOWN STRL REUS W/ TWL LRG LVL3 (GOWN DISPOSABLE) ×1 IMPLANT
GOWN STRL REUS W/ TWL XL LVL3 (GOWN DISPOSABLE) ×1 IMPLANT
GOWN STRL REUS W/TWL LRG LVL3 (GOWN DISPOSABLE) ×3
GOWN STRL REUS W/TWL XL LVL3 (GOWN DISPOSABLE) ×3
IV LACTATED RINGER IRRG 3000ML (IV SOLUTION) ×12
IV LR IRRIG 3000ML ARTHROMATIC (IV SOLUTION) ×4 IMPLANT
KIT TURNOVER KIT A (KITS) ×3 IMPLANT
MANIFOLD NEPTUNE II (INSTRUMENTS) ×6 IMPLANT
MAT ABSORB  FLUID 56X50 GRAY (MISCELLANEOUS) ×2
MAT ABSORB FLUID 56X50 GRAY (MISCELLANEOUS) ×1 IMPLANT
NEEDLE SPNL 20GX3.5 QUINCKE YW (NEEDLE) ×3 IMPLANT
PACK ARTHROSCOPY KNEE (MISCELLANEOUS) ×3 IMPLANT
PAD ABD DERMACEA PRESS 5X9 (GAUZE/BANDAGES/DRESSINGS) ×6 IMPLANT
PAD WRAPON POLAR KNEE (MISCELLANEOUS) ×1 IMPLANT
SUT ETHILON 4-0 (SUTURE) ×3
SUT ETHILON 4-0 FS2 18XMFL BLK (SUTURE) ×1
SUTURE ETHLN 4-0 FS2 18XMF BLK (SUTURE) ×1 IMPLANT
TUBING ARTHRO INFLOW-ONLY STRL (TUBING) ×3 IMPLANT
WAND WEREWOLF FLOW 90D (MISCELLANEOUS) ×3 IMPLANT
WRAPON POLAR PAD KNEE (MISCELLANEOUS) ×3

## 2020-05-16 NOTE — Discharge Instructions (Signed)
Post Op Home Instructions for Knee Arthroscopy ° °1) Do not sit for longer than 1 hour at a time with your leg dangling down.  You should have your legs elevated (higher than your heart) in a recliner chair or couch. ° °2) You may be up walking around as tolerated but should take periodic breaks to elevate your legs.  Discontinue use of crutches when you feel you are able to walk without pain or a limp. ° °3) Work on gentle bending and straightening of the knee. ° °4) You may remove the Ace wrap and dressings two days after surgery.  Place band aids over the incision sites. ° °5) You may shower after you remove the surgical dressing.  You do not need to cover the incision with plastic wrap.  The incision can get wet, but do not submerge under water.  After your sutures have been removed, you should wait 24 hours before submerging incision under water. ° °6) Pain medication can cause constipation.  You should increase your fluid intake, increase your intake of high fiber foods and/or take Metamucil as needed for constipation. ° °7) Continue your physical therapy exercises, as shown at the office, at least twice daily.  You should set up outpatient physical therapy and start within the first week after surgery. ° °8) Continue to use your Polar Pack continuously for 2-3 days after surgery.  After you remove the surgical dressing, it is a good idea to use your Polar Pack or ice pack for 30 minutes after doing your exercises to reduce swelling. ° °9) Do not be surprised if you have increased pain at night.  This usually means you have been a little too active during the day and need to reduce your activities. ° °10) If you develop lower extremity swelling that does not improve after a night of elevation, please call the office.  This could be an early sign of a blood clot. ° °Please call with any questions at [phone] ° ° °AMBULATORY SURGERY  °DISCHARGE INSTRUCTIONS ° ° °1) The drugs that you were given will stay in  your system until tomorrow so for the next 24 hours you should not: ° °A) Drive an automobile °B) Make any legal decisions °C) Drink any alcoholic beverage ° ° °2) You may resume regular meals tomorrow.  Today it is better to start with liquids and gradually work up to solid foods. ° °You may eat anything you prefer, but it is better to start with liquids, then soup and crackers, and gradually work up to solid foods. ° ° °3) Please notify your doctor immediately if you have any unusual bleeding, trouble breathing, redness and pain at the surgery site, drainage, fever, or pain not relieved by medication. ° ° ° °4) Additional Instructions: ° ° ° ° ° ° ° °Please contact your physician with any problems or Same Day Surgery at [phone], Monday through Friday 6 am to 4 pm, or [place] [facility] [place] Main number at [phone]. °

## 2020-05-16 NOTE — OR Nursing (Signed)
Pt's pain 9 out of 10. MD notified via phone while in [place]. Awaiting for new orders. Continue to monitor.Marland Kitchen

## 2020-05-16 NOTE — Anesthesia Preprocedure Evaluation (Addendum)
Anesthesia Evaluation  Patient identified by MRN, date of birth, ID band Patient awake    Reviewed: Allergy & Precautions, NPO status , Patient's Chart, lab work & pertinent test results  History of Anesthesia Complications Negative for: history of anesthetic complications  Airway Mallampati: II       Dental   Pulmonary neg sleep apnea, neg COPD, Not current smoker,           Cardiovascular (-) hypertension(-) Past MI and (-) CHF (-) dysrhythmias + Valvular Problems/Murmurs      Neuro/Psych neg Seizures    GI/Hepatic Neg liver ROS, neg GERD  ,  Endo/Other  neg diabetes  Renal/GU negative Renal ROS     Musculoskeletal   Abdominal   Peds  Hematology  (+) anemia ,   Anesthesia Other Findings   Reproductive/Obstetrics                           Anesthesia Physical Anesthesia Plan  ASA: II  Anesthesia Plan: General   Post-op Pain Management:    Induction:   PONV Risk Score and Plan: 3 and Ondansetron, Dexamethasone and Midazolam  Airway Management Planned: LMA  Additional Equipment:   Intra-op Plan:   Post-operative Plan:   Informed Consent: I have reviewed the patients History and Physical, chart, labs and discussed the procedure including the risks, benefits and alternatives for the proposed anesthesia with the patient or authorized representative who has indicated his/her understanding and acceptance.       Plan Discussed with:   Anesthesia Plan Comments:        Anesthesia Quick Evaluation

## 2020-05-16 NOTE — Transfer of Care (Signed)
Immediate Anesthesia Transfer of Care Note  Patient: Ann Vega  Procedure(s) Performed: KNEE ARTHROSCOPY WITH PARTIAL MEDIAL OR LATERAL MENISCECTOMY (Right Knee)  Patient Location: PACU  Anesthesia Type:General  Level of Consciousness: sedated  Airway & Oxygen Therapy: Patient connected to face mask oxygen  Post-op Assessment: Post -op Vital signs reviewed and stable  Post vital signs: stable  Last Vitals:  Vitals Value Taken Time  BP 110/59   Temp    Pulse 63 05/16/20 0936  Resp 15 05/16/20 0936  SpO2 100 % 05/16/20 0936  Vitals shown include unvalidated device data.  Last Pain:  Vitals:   05/16/20 0707  TempSrc: Temporal  PainSc: 7          Complications: No complications documented.

## 2020-05-16 NOTE — Anesthesia Postprocedure Evaluation (Signed)
Anesthesia Post Note  Patient: Ann Vega  Procedure(s) Performed: KNEE ARTHROSCOPY WITH PARTIAL MEDIAL OR LATERAL MENISCECTOMY (Right Knee)  Patient location during evaluation: PACU Anesthesia Type: General Level of consciousness: awake and alert Pain management: pain level controlled Vital Signs Assessment: post-procedure vital signs reviewed and stable Respiratory status: spontaneous breathing and respiratory function stable Cardiovascular status: stable Anesthetic complications: no   No complications documented.   Last Vitals:  Vitals:   05/16/20 0707  BP: 116/89  Pulse: 78  Resp: 18  Temp: 36.8 C  SpO2: 100%    Last Pain:  Vitals:   05/16/20 0707  TempSrc: Temporal  PainSc: 7                  Areta Terwilliger K

## 2020-05-16 NOTE — H&P (Signed)
The patient has been re-examined, and the chart reviewed, and there have been no interval changes to the documented history and physical.  Plan a right knee scope today.  Anesthesia is not consulted regarding a peripheral nerve block for post-operative pain.  The risks, benefits, and alternatives have been discussed at length, and the patient is willing to proceed.    

## 2020-05-16 NOTE — Op Note (Signed)
  PATIENT:  Ann Vega  PRE-OPERATIVE DIAGNOSIS:  S83.206A Unsp tear of unsp meniscus, current injury, right knee, init M23.41 Loose body in knee, right knee  POST-OPERATIVE DIAGNOSIS:  Same  PROCEDURE:  RIGHT KNEE ARTHROSCOPY WITH PARTIAL MEDIAL MENISCECTOMY, PARTIAL SYNOVECTOMY  SURGEON:  Kurtis Bushman, MD  ANESTHESIA:   General  PREOPERATIVE INDICATIONS:  Ann Vega  34 y.o. female with a diagnosis of S83.206A Unsp tear of unsp meniscus, current injury, right knee, init M23.41 Loose body in knee, right knee who failed conservative management and elected for surgical management.    The risks benefits and alternatives were discussed with the patient preoperatively including the risks of infection, bleeding, nerve injury, knee stiffness, persistent pain, osteoarthritis and the need for further surgery. Medical  risks include DVT and pulmonary embolism, myocardial infarction, stroke, pneumonia, respiratory failure and death. The patient understood these risks and wished to proceed.   OPERATIVE FINDINGS: The suprapatellar pouch, medial and lateral gutters were inspected and found to be free of any loose bodies. The undersurface of the patella and landing zone were inspected and grade 1 chondromalacia was noted. There was no evidence of lateral subluxation. The medial compartment showed a complex tear of the posterior horn, then anterior horn was intact and stable to probing. Grade 1 chondromalacia was identified in the medial compartment.  The notch was inspected and the ACL and PCL were intact and stable to probing. The lateral compartment was entered and minimal degenerative changes were identified. The lateral meniscus had minimal degenerative tearing along the posterior horn. The anterior horn was intact a stable.  OPERATIVE PROCEDURE: Patient was met in the preoperative area. The operative extremity was signed with my initials according the hospital's correct site of surgery  protocol.  The patient was brought to the operating room where they was placed supine on the operative table. General anesthesia was administered. The patient was prepped and draped in a sterile fashion.  A timeout was performed to verify the patient's name, date of birth, medical record number, correct site of surgery correct procedure to be performed. It was also used to verify the patient received antibiotics that all appropriate instruments, and radiographic studies were available in the room. Once all in attendance were in agreement, the case began.  Proposed arthroscopy incisions were drawn out with a surgical marker. These were pre-injected with 0.5% marcaine with epinephrine. An 11 blade was used to establish an inferior lateral and inferomedial portals. The inferomedial portal was created using a 18-gauge spinal needle under direct visualization.  A full diagnostic examination of the knee was performed, please see findings for a complete list of results.  Patient had the meniscal tear treated with a 4-0 resector shaver blade and straight duckbill basket. The meniscus was debrided until a stable rim was achieved.  A partial synovectomy was also performed in all three compartments using a 4-0 resector shaver blade and electrocautery.  The knee was then copiously lavaged. All arthroscopic instruments were removed. The 2 arthroscopy portals were closed with 4-0 nylon. A dry sterile and compressive dressing was applied. The patient was brought to the PACU in stable condition. I was scrubbed and present for the entire case and all sharp and instrument counts were correct at the conclusion the case. I spoke with the patient's family postoperatively to let them know the case was performed without complication and the patient was stable in the recovery room.  Kurtis Bushman, MD

## 2020-05-16 NOTE — OR Nursing (Signed)
Incentive spirometer given and explained with return demonstration. 

## 2020-05-16 NOTE — Anesthesia Procedure Notes (Signed)
Procedure Name: LMA Insertion Date/Time: 05/16/2020 8:41 AM Performed by: Aline Brochure, CRNA Pre-anesthesia Checklist: Patient identified, Emergency Drugs available, Suction available and Patient being monitored Patient Re-evaluated:Patient Re-evaluated prior to induction Oxygen Delivery Method: Circle system utilized Preoxygenation: Pre-oxygenation with 100% oxygen Induction Type: IV induction Ventilation: Mask ventilation without difficulty LMA: LMA inserted LMA Size: 3.5 Number of attempts: 1 Placement Confirmation: positive ETCO2 and breath sounds checked- equal and bilateral Tube secured with: Tape Dental Injury: Teeth and Oropharynx as per pre-operative assessment

## 2021-07-18 ENCOUNTER — Ambulatory Visit (INDEPENDENT_AMBULATORY_CARE_PROVIDER_SITE_OTHER): Payer: BC Managed Care – PPO | Admitting: Obstetrics and Gynecology

## 2021-07-18 ENCOUNTER — Other Ambulatory Visit: Payer: Self-pay

## 2021-07-18 ENCOUNTER — Encounter: Payer: Self-pay | Admitting: Obstetrics and Gynecology

## 2021-07-18 VITALS — BP 123/70 | HR 74 | Ht 67.0 in | Wt 162.8 lb

## 2021-07-18 DIAGNOSIS — Z124 Encounter for screening for malignant neoplasm of cervix: Secondary | ICD-10-CM

## 2021-07-18 DIAGNOSIS — Z01419 Encounter for gynecological examination (general) (routine) without abnormal findings: Secondary | ICD-10-CM | POA: Diagnosis not present

## 2021-07-18 NOTE — Progress Notes (Addendum)
Patient presents for annual exam.  Annual labs ordered, patient will completed at a later time due to fasting. Patient states no other questions or concerns.

## 2021-07-18 NOTE — Progress Notes (Signed)
HPI:      Ann Vega is a 36 y.o. G0P0000 who LMP was Patient's last menstrual period was 10/19/2015.  Subjective:   She presents today for her annual examination.  She is also here to establish care at [facility].  She has no complaints.  She says that she is doing well. Of significant note patient has had a hysterectomy for heavy painful menstrual bleeding and was found at pathology to have uterine fibroids.    Hx: The following portions of the patient's history were reviewed and updated as appropriate:             She  has a past medical history of Anemia, Heavy periods, Murmur, cardiac, and Shortness of breath dyspnea. She does not have any pertinent problems on file. She  has a past surgical history that includes Tonsillectomy; Wisdom tooth extraction; Laparoscopic hysterectomy (Bilateral, 11/08/2015); Cystoscopy (N/A, 11/08/2015); and Knee arthroscopy with medial menisectomy (Right, 05/16/2020). Her family history is not on file. She  reports that she has never smoked. She has never used smokeless tobacco. She reports current alcohol use. She reports that she does not use drugs. She has a current medication list which includes the following prescription(s): acetaminophen, alprazolam, bc fast pain relief, and meloxicam. She has No Known Allergies.       Review of Systems:  Review of Systems  Constitutional: Denied constitutional symptoms, night sweats, recent illness, fatigue, fever, insomnia and weight loss.  Eyes: Denied eye symptoms, eye pain, photophobia, vision change and visual disturbance.  Ears/Nose/Throat/Neck: Denied ear, nose, throat or neck symptoms, hearing loss, nasal discharge, sinus congestion and sore throat.  Cardiovascular: Denied cardiovascular symptoms, arrhythmia, chest pain/pressure, edema, exercise intolerance, orthopnea and palpitations.  Respiratory: Denied pulmonary symptoms, asthma, pleuritic pain, productive sputum, cough, dyspnea and wheezing.   Gastrointestinal: Denied, gastro-esophageal reflux, melena, nausea and vomiting.  Genitourinary: Denied genitourinary symptoms including symptomatic vaginal discharge, pelvic relaxation issues, and urinary complaints.  Musculoskeletal: Denied musculoskeletal symptoms, stiffness, swelling, muscle weakness and myalgia.  Dermatologic: Denied dermatology symptoms, rash and scar.  Neurologic: Denied neurology symptoms, dizziness, headache, neck pain and syncope.  Psychiatric: Denied psychiatric symptoms, anxiety and depression.  Endocrine: Denied endocrine symptoms including hot flashes and night sweats.   Meds:   Current Outpatient Medications on File Prior to Visit  Medication Sig Dispense Refill   acetaminophen (TYLENOL) 650 MG CR tablet Take 1,300 mg by mouth every 8 (eight) hours as needed for pain (Knee).     ALPRAZolam (XANAX) 0.25 MG tablet Take 0.25 mg by mouth daily as needed for anxiety.     Aspirin-Salicylamide-Caffeine (BC FAST PAIN RELIEF) 650-195-33.3 MG PACK Take 1 Package by mouth daily as needed (Knee pain).     meloxicam (MOBIC) 15 MG tablet Take 15 mg by mouth daily with supper.     No current facility-administered medications on file prior to visit.     Objective:     Vitals:   07/18/21 1343  BP: 123/70  Pulse: 74    Filed Weights   07/18/21 1343  Weight: 162 lb 12.8 oz (73.8 kg)              Physical examination General NAD, Conversant  HEENT Atraumatic; Op clear with mmm.  Normo-cephalic. Pupils reactive. Anicteric sclerae  Thyroid/Neck Smooth without nodularity or enlargement. Normal ROM.  Neck Supple.  Skin No rashes, lesions or ulceration. Normal palpated skin turgor. No nodularity.  Breasts: No masses or discharge.  Symmetric.  No axillary adenopathy.  Lungs: Clear to auscultation.No rales or wheezes. Normal Respiratory effort, no retractions.  Heart: NSR.  No murmurs or rubs appreciated. No periferal edema  Abdomen: Soft.  Non-tender.  No masses.  No  HSM. No hernia  Extremities: Moves all appropriately.  Normal ROM for age. No lymphadenopathy.  Neuro: Oriented to PPT.  Normal mood. Normal affect.     Pelvic:   Vulva: Normal appearance.  No lesions.   Vagina: No lesions or abnormalities noted.  Support: Normal pelvic support.  Urethra No masses tenderness or scarring.  Meatus Normal size without lesions or prolapse.  Cervix: Surgically absent   Anus: Normal exam.  No lesions.  Perineum: Normal exam.  No lesions.        Bimanual   Uterus: Surgically absent   Adnexae: No masses.  Non-tender to palpation.  Cul-de-sac: Negative for abnormality.     Assessment:    G0P0000 Patient Active Problem List   Diagnosis Date Noted   Medial epicondylitis of elbow, left 12/31/2016   Disorder of rotator cuff, left 12/31/2016   Fibroid 11/08/2015   Menorrhagia 11/08/2015   Fatigue 11/08/2015   Cardiac murmur 09/19/2015     1. Well woman exam with routine gynecological exam        Plan:            1.  Basic Screening Recommendations The basic screening recommendations for asymptomatic women were discussed with the patient during her visit.  The age-appropriate recommendations were discussed with her and the rational for the tests reviewed.  When I am informed by the patient that another primary care physician has previously obtained the age-appropriate tests and they are up-to-date, only outstanding tests are ordered and referrals given as necessary.  Abnormal results of tests will be discussed with her when all of her results are completed.  Routine preventative health maintenance measures emphasized: Exercise/Diet/Weight control, Tobacco Warnings, Alcohol/Substance use risks and Stress Management Patient will return for lab work when she is fasting  Orders Orders Placed This Encounter  Procedures   Basic metabolic panel   CBC   Cholesterol, total   TSH   Lipid panel   Hemoglobin A1c    No orders of the defined types were  placed in this encounter.         F/U  No follow-ups on file.  Finis Bud, M.D. 07/18/2021 2:14 PM

## 2021-07-19 ENCOUNTER — Other Ambulatory Visit: Payer: BC Managed Care – PPO

## 2021-07-20 LAB — BASIC METABOLIC PANEL
BUN/Creatinine Ratio: 12 (ref 9–23)
BUN: 10 mg/dL (ref 6–20)
CO2: 20 mmol/L (ref 20–29)
Calcium: 9.9 mg/dL (ref 8.7–10.2)
Chloride: 101 mmol/L (ref 96–106)
Creatinine, Ser: 0.81 mg/dL (ref 0.57–1.00)
Glucose: 88 mg/dL (ref 70–99)
Potassium: 4.3 mmol/L (ref 3.5–5.2)
Sodium: 138 mmol/L (ref 134–144)
eGFR: 96 mL/min/{1.73_m2} (ref >59)

## 2021-07-20 LAB — LIPID PANEL
Chol/HDL Ratio: 2.8 ratio (ref 0.0–4.4)
Cholesterol, Total: 182 mg/dL (ref 100–199)
HDL: 66 mg/dL (ref >39)
LDL Chol Calc (NIH): 104 mg/dL — ABNORMAL HIGH (ref 0–99)
Triglycerides: 66 mg/dL (ref 0–149)
VLDL Cholesterol Cal: 12 mg/dL (ref 5–40)

## 2021-07-20 LAB — CBC
Hematocrit: 40.6 % (ref 34.0–46.6)
Hemoglobin: 13.7 g/dL (ref 11.1–15.9)
MCH: 29.7 pg (ref 26.6–33.0)
MCHC: 33.7 g/dL (ref 31.5–35.7)
MCV: 88 fL (ref 79–97)
Platelets: 316 10*3/uL (ref 150–450)
RBC: 4.61 x10E6/uL (ref 3.77–5.28)
RDW: 12.3 % (ref 11.7–15.4)
WBC: 8.2 10*3/uL (ref 3.4–10.8)

## 2021-07-20 LAB — HEMOGLOBIN A1C
Est. average glucose Bld gHb Est-mCnc: 114 mg/dL
Hgb A1c MFr Bld: 5.6 % (ref 4.8–5.6)

## 2021-07-20 LAB — TSH: TSH: 0.316 u[IU]/mL — ABNORMAL LOW (ref 0.450–4.500)

## 2021-07-29 ENCOUNTER — Other Ambulatory Visit: Payer: Self-pay | Admitting: Obstetrics and Gynecology

## 2021-07-29 DIAGNOSIS — R7989 Other specified abnormal findings of blood chemistry: Secondary | ICD-10-CM

## 2021-07-29 NOTE — Progress Notes (Signed)
[place]: Your screening thyroid test is slightly abnormal.  To check this further I would like you to have a full thyroid panel done to see if there is truly any abnormality present.  Please call the office to schedule this Dr. Amalia Hailey

## 2021-07-30 NOTE — Progress Notes (Signed)
Forwarded recommendation to patient via [name]

## 2022-10-12 ENCOUNTER — Emergency Department
Admission: EM | Admit: 2022-10-12 | Discharge: 2022-10-12 | Disposition: A | Discharge status: Home / Self Care | Payer: BC Managed Care – PPO | Attending: Emergency Medicine | Admitting: Emergency Medicine

## 2022-10-12 ENCOUNTER — Other Ambulatory Visit: Payer: Self-pay

## 2022-10-12 DIAGNOSIS — L03213 Periorbital cellulitis: Secondary | ICD-10-CM | POA: Insufficient documentation

## 2022-10-12 DIAGNOSIS — W458XXA Other foreign body or object entering through skin, initial encounter: Secondary | ICD-10-CM | POA: Insufficient documentation

## 2022-10-12 DIAGNOSIS — S0502XA Injury of conjunctiva and corneal abrasion without foreign body, left eye, initial encounter: Secondary | ICD-10-CM | POA: Diagnosis not present

## 2022-10-12 DIAGNOSIS — Y92007 Garden or yard of unspecified non-institutional (private) residence as the place of occurrence of the external cause: Secondary | ICD-10-CM | POA: Insufficient documentation

## 2022-10-12 DIAGNOSIS — H5712 Ocular pain, left eye: Secondary | ICD-10-CM | POA: Diagnosis present

## 2022-10-12 MED ORDER — FLUORESCEIN SODIUM 1 MG OP STRP
1.0000 | ORAL_STRIP | Freq: Once | OPHTHALMIC | Status: AC
  Administered 2022-10-12: 1 via OPHTHALMIC
  Filled 2022-10-12: qty 1

## 2022-10-12 MED ORDER — AMOXICILLIN-POT CLAVULANATE 875-125 MG PO TABS
1.0000 | ORAL_TABLET | Freq: Two times a day (BID) | ORAL | 0 refills | Status: AC
Start: 2022-10-12 — End: 2022-10-19

## 2022-10-12 MED ORDER — PROPARACAINE HCL 0.5 % OP SOLN
1.0000 [drp] | Freq: Once | OPHTHALMIC | Status: AC
Start: 2022-10-12 — End: 2022-10-12
  Administered 2022-10-12: 1 [drp] via OPHTHALMIC
  Filled 2022-10-12: qty 15

## 2022-10-12 MED ORDER — ERYTHROMYCIN 5 MG/GM OP OINT
1.0000 | TOPICAL_OINTMENT | Freq: Every day | OPHTHALMIC | 0 refills | Status: AC
Start: 2022-10-12 — End: ?

## 2022-10-12 NOTE — ED Provider Notes (Signed)
[facility] Provider Note    Event Date/Time   First MD Initiated Contact with Patient 10/12/22 1103     (approximate)   History   Eye Problem (left)   HPI  Ann Vega is a 37 y.o. female who presents today for evaluation of left eye discomfort and tearing since yesterday.  Patient reports that she was working in the garden and was weeding when she felt something fly into her eye.  She flushed her eye, but today she still feels like there is something in her eye.  She denies any changes to her vision.  She does not wear contact lenses.  Patient Active Problem List   Diagnosis Date Noted   Medial epicondylitis of elbow, left 12/31/2016   Disorder of rotator cuff, left 12/31/2016   Fibroid 11/08/2015   Menorrhagia 11/08/2015   Fatigue 11/08/2015   Cardiac murmur 09/19/2015          Physical Exam   Triage Vital Signs: ED Triage Vitals  Enc Vitals Group     BP 10/12/22 1020 (!) 140/78     Pulse Rate 10/12/22 1020 77     Resp 10/12/22 1020 17     Temp 10/12/22 1020 98.5 F (36.9 C)     Temp Source 10/12/22 1020 Oral     SpO2 10/12/22 1020 99 %     Weight 10/12/22 1020 155 lb (70.3 kg)     Height 10/12/22 1020 5\' 7"  (1.702 m)     Head Circumference --      Peak Flow --      Pain Score 10/12/22 1019 10     Pain Loc --      Pain Edu? --      Excl. in GC? --     Most recent vital signs: Vitals:   10/12/22 1020  BP: (!) 140/78  Pulse: 77  Resp: 17  Temp: 98.5 F (36.9 C)  SpO2: 99%    Physical Exam Vitals and nursing note reviewed.  Constitutional:      General: Awake and alert. No acute distress.    Appearance: Normal appearance. The patient is normal weight.  HENT:     Head: Normocephalic and atraumatic.     Mouth: Mucous membranes are moist.  Eyes:     General: PERRL. Normal EOMs        Right eye: No discharge.        Left eye: No scleral injection.  Mild tearing from the left eye.  Pupil is round and reactive.  There is  mild periorbital edema.  No erythema.  No ecchymosis.  No purulent drainage.  Normal lashes.  No ptosis.  There is a punctate area of fluorescein uptake at the 7 o'clock position.  Normal ocular pressure of 15.  No visible foreign body.    Conjunctiva/sclera: Conjunctivae normal.  Cardiovascular:     Rate and Rhythm: Normal rate and regular rhythm.     Pulses: Normal pulses.  Pulmonary:     Effort: Pulmonary effort is normal. No respiratory distress.     Breath sounds: Normal breath sounds.  Abdominal:     Abdomen is soft. There is no abdominal tenderness. No rebound or guarding. No distention. Musculoskeletal:        General: No swelling. Normal range of motion.     Cervical back: Normal range of motion and neck supple.  Skin:    General: Skin is warm and dry.     Capillary Refill:  Capillary refill takes less than 2 seconds.     Findings: No rash.  Neurological:     Mental Status: The patient is awake and alert.      ED Results / Procedures / Treatments   Labs (all labs ordered are listed, but only abnormal results are displayed) Labs Reviewed - No data to display   EKG     RADIOLOGY     PROCEDURES:  Critical Care performed:   Procedures   MEDICATIONS ORDERED IN ED: Medications  proparacaine (ALCAINE) 0.5 % ophthalmic solution 1 drop (1 drop Left Eye Given 10/12/22 1148)  fluorescein ophthalmic strip 1 strip (1 strip Left Eye Given 10/12/22 1148)     IMPRESSION / MDM / ASSESSMENT AND PLAN / ED COURSE  I reviewed the triage vital signs and the nursing notes.   Differential diagnosis includes, but is not limited to, corneal abrasion, keratitis, retained foreign body, preseptal cellulitis.  Patient is awake and alert, hemodynamically stable and afebrile.  Visibly uncomfortable and has tearing from her left eye.  Patient's pain resolved immediately after proparacaine drops applied.  Patient does have a punctate area of fluorescein uptake at the 7 o'clock position  consistent with corneal abrasion.  There is no evidence of retained foreign body.  Eyelids were everted, no foreign body under the eyelid.  She has normal ocular pressure of 15, not consistent with acute angle-closure glaucoma.   She does have periorbital edema, likely from rubbing but it is possible that she is developing an early preseptal cellulitis.  She has full and painless extraocular movements, there is no proptosis or resistance to retropulsion, I do not suspect orbital cellulitis at this time.  She denies any changes in her vision.  She does not wear contact lenses.  She was started on erythromycin ointment for her corneal abrasion and Augmentin for her possible early preseptal pyelitis.  We discussed strict return precautions and the importance of close outpatient follow-up.  She was given the information for ophthalmology.  Patient understands and agrees with plan.  She was discharged in stable condition.   Patient's presentation is most consistent with acute complicated illness / injury requiring diagnostic workup.      FINAL CLINICAL IMPRESSION(S) / ED DIAGNOSES   Final diagnoses:  Abrasion of left cornea, initial encounter  Preseptal cellulitis of left eye     Rx / DC Orders   ED Discharge Orders          Ordered    erythromycin ophthalmic ointment  Daily at bedtime        10/12/22 1143    amoxicillin-clavulanate (AUGMENTIN) 875-125 MG tablet  2 times daily        10/12/22 1143             Note:  This document was prepared using Dragon voice recognition software and may include unintentional dictation errors.   Keturah Shavers 10/12/22 1237    Minna Antis, MD 10/12/22 1448

## 2022-10-12 NOTE — Discharge Instructions (Signed)
Please take antibiotics as prescribed.  Please return for any new, worsening, or change in symptoms or other concerns.  Please schedule follow-up appointment with ophthalmology if your symptoms persist.  It was a pleasure caring for you today.

## 2022-10-12 NOTE — ED Triage Notes (Signed)
Pt states yesterday at 1pm she was weed eating her lawn and debrie got into her eye. Pt states unknown blurred vision due to the eyes watering. Area around the left eye noted to be swollen. Pt states flushing here eye yesterday with water.

## 2022-12-01 ENCOUNTER — Ambulatory Visit
Admission: EM | Admit: 2022-12-01 | Discharge: 2022-12-01 | Disposition: A | Discharge status: Home / Self Care | Payer: BC Managed Care – PPO | Attending: Physician Assistant | Admitting: Physician Assistant

## 2022-12-01 DIAGNOSIS — H6991 Unspecified Eustachian tube disorder, right ear: Secondary | ICD-10-CM

## 2022-12-01 DIAGNOSIS — H8111 Benign paroxysmal vertigo, right ear: Secondary | ICD-10-CM | POA: Diagnosis not present

## 2022-12-01 MED ORDER — MECLIZINE HCL 25 MG PO TABS: 25.0000 mg | ORAL_TABLET | Freq: Three times a day (TID) | ORAL | 0 refills | Status: AC | PRN

## 2022-12-01 NOTE — ED Triage Notes (Addendum)
Pt states when she went to lay down after getting up to use restroom in the morning and started having dizziness, pt reports if she moves a certain way she gets the dizzy spells, mostly positional. Pt reports a dull feeling in RT ear, but no pain.

## 2022-12-01 NOTE — Discharge Instructions (Signed)
-  Use OTC Sudafed and Flonase -I sent medication for dizziness -Increase rest and fluids and be careful with position changes -If chest pain, headaches, vision changes, palpitations, shortness of breath, fatigue, vomiting, please return or go to ED for further workup.

## 2022-12-01 NOTE — ED Provider Notes (Signed)
[facility]    CSN: [id number] Arrival date & time: 12/01/22  0813      History   Chief Complaint No chief complaint on file.   HPI Ann Vega is a 37 y.o. female presenting for right ear pressure/fullness/dull ache and dizziness with movements of head for the past couple of days.  She denies any associated ear pain, drainage.  She reports having nasal congestion a couple days ago which has resolved.  She states she recently traveled to the mountains and that is when symptoms sort of started.  Reports dizziness was initially really bad and she had to hold onto the wall because she felt so dizzy.  Dizziness has gotten a little better.  It has improved a little with taking Sudafed.  Denies fever, fatigue, cough, sore throat, sinus pain.  No headaches, chest pain, palpitations, shortness of breath, abdominal pain, nausea or vomiting.  No history of dizziness, positional vertigo, Mnire's, eustachian tube problems, etc.  No other complaints.  HPI  Past Medical History:  Diagnosis Date   Anemia    Heavy periods    Murmur, cardiac    Shortness of breath dyspnea     Patient Active Problem List   Diagnosis Date Noted   Medial epicondylitis of elbow, left 12/31/2016   Disorder of rotator cuff, left 12/31/2016   Fibroid 11/08/2015   Menorrhagia 11/08/2015   Fatigue 11/08/2015   Cardiac murmur 09/19/2015    Past Surgical History:  Procedure Laterality Date   CYSTOSCOPY N/A 11/08/2015   Procedure: CYSTOSCOPY;  Surgeon: Nadara Mustard, MD;  Location: [facility];  Service: Gynecology;  Laterality: N/A;   KNEE ARTHROSCOPY WITH MEDIAL MENISECTOMY Right 05/16/2020   Procedure: KNEE ARTHROSCOPY WITH PARTIAL MEDIAL OR LATERAL MENISCECTOMY;  Surgeon: Lyndle Herrlich, MD;  Location: [facility];  Service: Orthopedics;  Laterality: Right;   LAPAROSCOPIC HYSTERECTOMY Bilateral 11/08/2015   Procedure: HYSTERECTOMY TOTAL LAPAROSCOPIC / BILATERAL SALPINGECTOMY;  Surgeon: Nadara Mustard, MD;   Location: [facility];  Service: Gynecology;  Laterality: Bilateral;   TONSILLECTOMY     WISDOM TOOTH EXTRACTION      OB History     Gravida  0   Para  0   Term  0   Preterm  0   AB  0   Living  0      SAB  0   IAB  0   Ectopic  0   Multiple  0   Live Births  0            Home Medications    Prior to Admission medications   Medication Sig Start Date End Date Taking? Authorizing Provider  meclizine (ANTIVERT) 25 MG tablet Take 1 tablet (25 mg total) by mouth 3 (three) times daily as needed for dizziness. 12/01/22  Yes Shirlee Latch, PA-C  acetaminophen (TYLENOL) 650 MG CR tablet Take 1,300 mg by mouth every 8 (eight) hours as needed for pain (Knee).    [provider]  ALPRAZolam Prudy Feeler) 0.25 MG tablet Take 0.25 mg by mouth daily as needed for anxiety. 03/21/20   [provider]  Aspirin-Salicylamide-Caffeine (BC FAST PAIN RELIEF) 650-195-33.3 MG PACK Take 1 Package by mouth daily as needed (Knee pain).    [provider]  erythromycin ophthalmic ointment Place 1 Application into the left eye at bedtime. 10/12/22   Poggi, Herb Grays, PA-C  meloxicam (MOBIC) 15 MG tablet Take 15 mg by mouth daily with supper.    [provider]    Family History No family history on file.  Social History Social History   Tobacco Use   Smoking status: Never   Smokeless tobacco: Never  Vaping Use   Vaping Use: Never used  Substance Use Topics   Alcohol use: Yes    Comment: occ   Drug use: No     Allergies   Patient has no known allergies.   Review of Systems Review of Systems  Constitutional:  Negative for fatigue and fever.  HENT:  Positive for ear pain (dull ache). Negative for congestion, ear discharge, facial swelling, hearing loss, rhinorrhea, sinus pressure, sinus pain and sore throat.   Respiratory:  Negative for cough and shortness of breath.   Cardiovascular:  Negative for chest pain and palpitations.  Gastrointestinal:   Negative for abdominal pain, nausea and vomiting.  Musculoskeletal:  Negative for back pain.  Neurological:  Positive for dizziness. Negative for syncope, facial asymmetry, weakness, light-headedness, numbness and headaches.     Physical Exam Triage Vital Signs ED Triage Vitals  Enc Vitals Group     BP 12/01/22 0839 123/64     Pulse Rate 12/01/22 0839 (!) 55     Resp --      Temp 12/01/22 0839 98.2 F (36.8 C)     Temp Source 12/01/22 0839 Oral     SpO2 12/01/22 0839 99 %     Weight --      Height --      Head Circumference --      Peak Flow --      Pain Score 12/01/22 0838 0     Pain Loc --      Pain Edu? --      Excl. in GC? --    No data found.  Updated Vital Signs BP 123/64 (BP Location: Left Arm)   Pulse (!) 55   Temp 98.2 F (36.8 C) (Oral)   LMP 10/19/2015   SpO2 99%    Physical Exam Vitals and nursing note reviewed.  Constitutional:      General: She is not in acute distress.    Appearance: Normal appearance. She is not ill-appearing or toxic-appearing.  HENT:     Head: Normocephalic and atraumatic.     Right Ear: Ear canal and external ear normal. A middle ear effusion is present. Tympanic membrane is bulging (fluid level noted).     Left Ear: Tympanic membrane, ear canal and external ear normal.     Ears:     Comments: Cloudy Tms bilaterally    Nose: Nose normal.     Mouth/Throat:     Mouth: Mucous membranes are moist.     Pharynx: Oropharynx is clear.  Eyes:     General: No scleral icterus.       Right eye: No discharge.        Left eye: No discharge.     Conjunctiva/sclera: Conjunctivae normal.  Cardiovascular:     Rate and Rhythm: Regular rhythm. Bradycardia present.     Heart sounds: Normal heart sounds.  Pulmonary:     Effort: Pulmonary effort is normal. No respiratory distress.     Breath sounds: Normal breath sounds.  Musculoskeletal:     Cervical back: Neck supple.  Skin:    General: Skin is dry.  Neurological:     General: No  focal deficit present.     Mental Status: She is alert. Mental status is at baseline.     Motor: No weakness.  Gait: Gait normal.  Psychiatric:        Mood and Affect: Mood normal.        Behavior: Behavior normal.        Thought Content: Thought content normal.      UC Treatments / Results  Labs (all labs ordered are listed, but only abnormal results are displayed) Labs Reviewed - No data to display  EKG   Radiology No results found.  Procedures Procedures (including critical care time)  Medications Ordered in UC Medications - No data to display  Initial Impression / Assessment and Plan / UC Course  I have reviewed the triage vital signs and the nursing notes.  Pertinent labs & imaging results that were available during my care of the patient were reviewed by me and considered in my medical decision making (see chart for details).   37 year old female presents for right ear fullness/pressure/dull ache and dizziness for the past couple days. Recently returned from the mountains. Initially had congestion which has resolved.  On exam she has fluid level of right TM, cloudy bilateral Tms. Otherwise normal exam.  Suspect eustachian tube dysfunction, effusion of TM and BPPV. Advised decongestants, nasal sprays.  Sent meclizine to pharmacy.  Advised slow easy position changes and increasing fluids.  Reviewed return and ER precautions.   Final Clinical Impressions(s) / UC Diagnoses   Final diagnoses:  Benign paroxysmal positional vertigo of right ear  Eustachian tube dysfunction, right     Discharge Instructions      -Use OTC Sudafed and Flonase -I sent medication for dizziness -Increase rest and fluids and be careful with position changes -If chest pain, headaches, vision changes, palpitations, shortness of breath, fatigue, vomiting, please return or go to ED for further workup.     ED Prescriptions     Medication Sig Dispense Auth. Provider   meclizine  (ANTIVERT) 25 MG tablet Take 1 tablet (25 mg total) by mouth 3 (three) times daily as needed for dizziness. 30 tablet Gareth Morgan      PDMP not reviewed this encounter.   Shirlee Latch, PA-C 12/01/22 [phone]
